# Patient Record
Sex: Female | Born: 1985 | Race: Black or African American | Hispanic: No | Smoking: Never smoker
Health system: Southern US, Community
[De-identification: ages and names within clinical notes are randomized; demographics above are authoritative.]

## PROBLEM LIST (undated history)

## (undated) ENCOUNTER — Inpatient Hospital Stay (HOSPITAL_COMMUNITY): Payer: Self-pay

## (undated) DIAGNOSIS — N809 Endometriosis, unspecified: Secondary | ICD-10-CM

## (undated) DIAGNOSIS — I493 Ventricular premature depolarization: Secondary | ICD-10-CM

## (undated) DIAGNOSIS — I499 Cardiac arrhythmia, unspecified: Secondary | ICD-10-CM

## (undated) DIAGNOSIS — N6011 Diffuse cystic mastopathy of right breast: Secondary | ICD-10-CM

## (undated) HISTORY — DX: Diffuse cystic mastopathy of right breast: N60.11

## (undated) HISTORY — PX: WISDOM TOOTH EXTRACTION: SHX21

## (undated) HISTORY — DX: Ventricular premature depolarization: I49.3

---

## 2007-04-27 ENCOUNTER — Ambulatory Visit: Payer: Self-pay | Admitting: Family

## 2007-04-30 ENCOUNTER — Encounter: Admission: RE | Admit: 2007-04-30 | Discharge: 2007-04-30 | Payer: Self-pay | Admitting: Obstetrics & Gynecology

## 2007-05-04 ENCOUNTER — Ambulatory Visit: Payer: Self-pay | Admitting: Family

## 2007-06-01 ENCOUNTER — Ambulatory Visit: Payer: Self-pay | Admitting: Family

## 2007-10-26 ENCOUNTER — Ambulatory Visit: Payer: Self-pay | Admitting: Physician Assistant

## 2008-03-03 ENCOUNTER — Ambulatory Visit: Payer: Self-pay | Admitting: Obstetrics and Gynecology

## 2008-03-05 ENCOUNTER — Encounter: Payer: Self-pay | Admitting: Obstetrics and Gynecology

## 2008-03-05 LAB — CONVERTED CEMR LAB: hCG, Beta Chain, Quant, S: 12722.4 milliintl units/mL

## 2008-04-04 ENCOUNTER — Ambulatory Visit: Payer: Self-pay | Admitting: Obstetrics and Gynecology

## 2009-03-04 ENCOUNTER — Ambulatory Visit: Payer: Self-pay | Admitting: Obstetrics & Gynecology

## 2009-03-05 ENCOUNTER — Encounter: Payer: Self-pay | Admitting: Obstetrics & Gynecology

## 2009-03-05 LAB — CONVERTED CEMR LAB

## 2009-03-28 IMAGING — US US PELVIS COMPLETE MODIFY
1 series · 14 of 25 positions shown · non-contrast
Comparison: none

CLINICAL DATA: Left-sided pelvic pain.
 TRANSABDOMINAL AND TRANSVAGINAL PELVIC ULTRASOUND:
TECHNIQUE: Both transabdominal and transvaginal ultrasound examinations of the pelvis were performed, including evaluation of the uterus, ovaries, adnexal regions, and pelvic cul-de-sac.

[Series 1: unknown · 0.21mm/px · 14 of 51 slices shown]
[im 1/51]
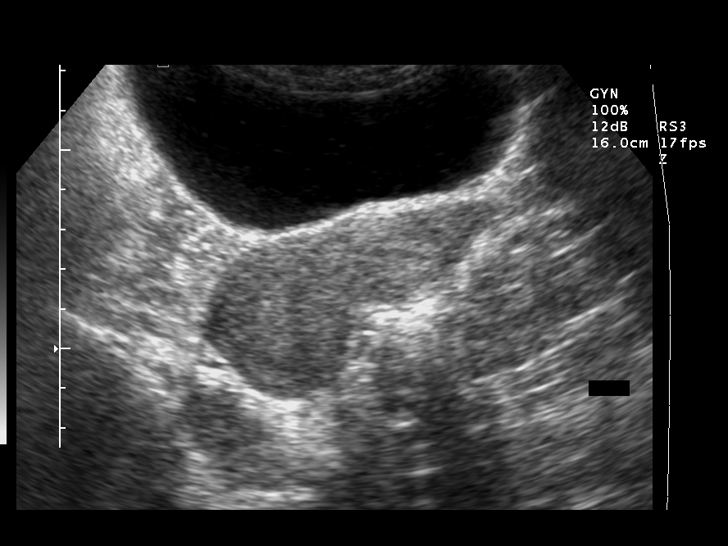
[im 5/51]
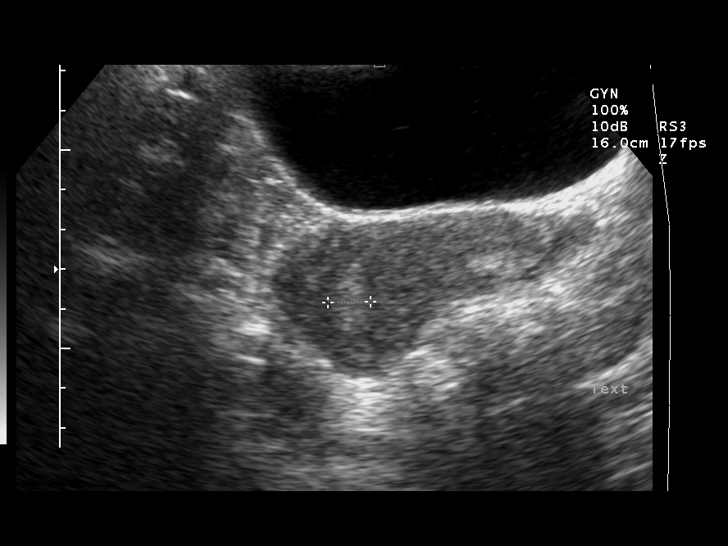
[im 9/51]
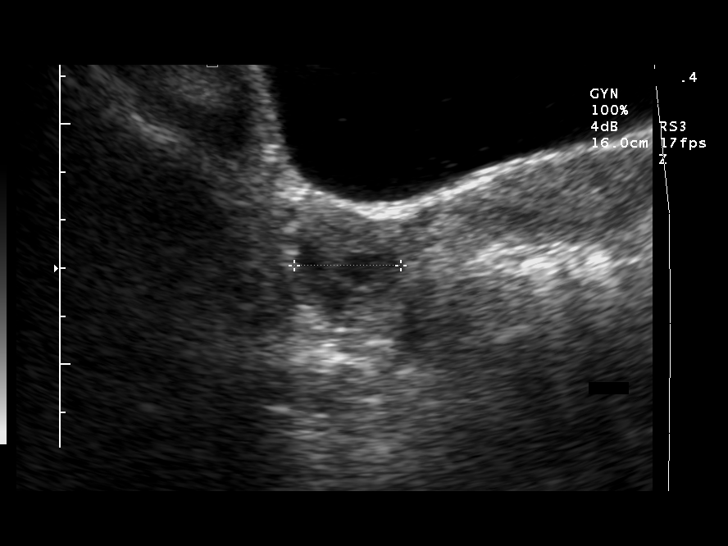
[im 13/51]
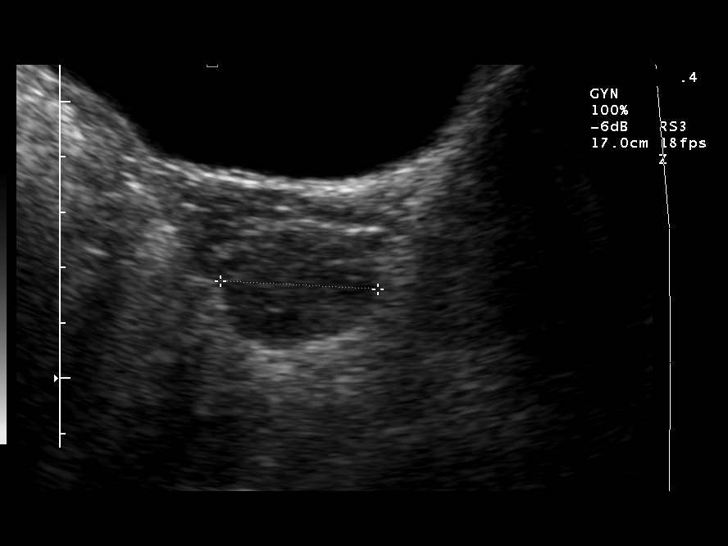
[im 17/51]
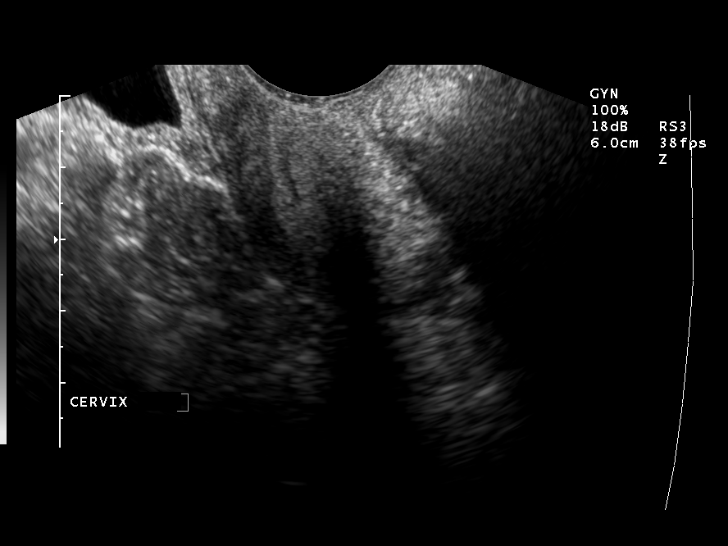
[im 19/51]
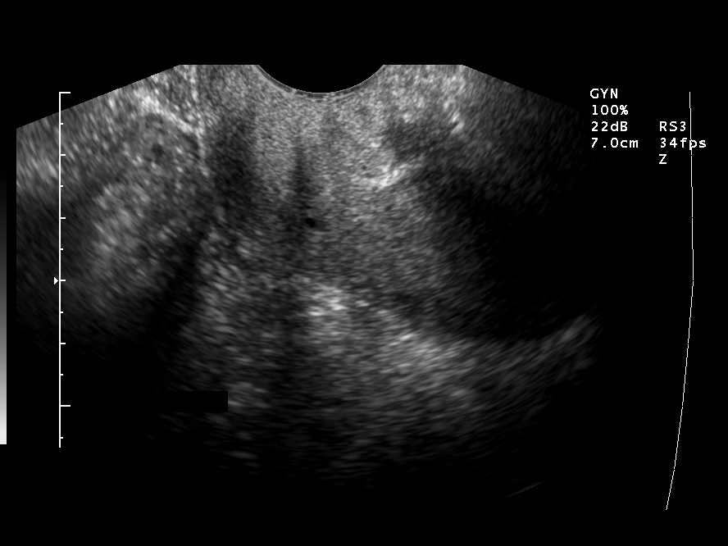
[im 23/51]
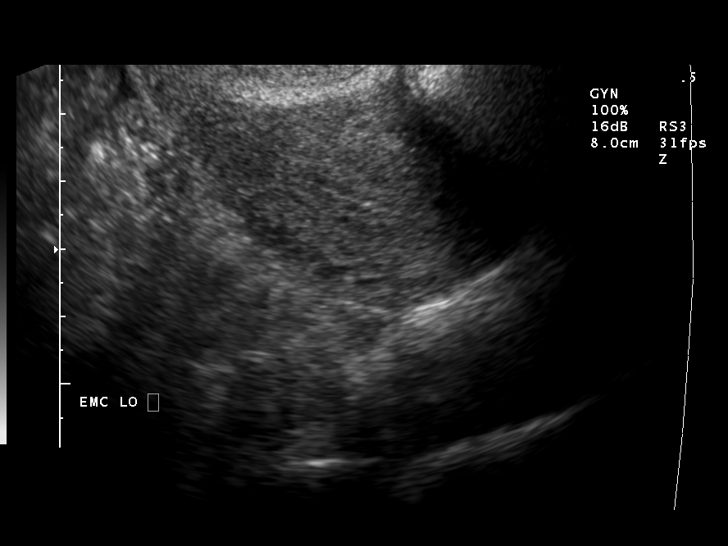
[im 28/51]
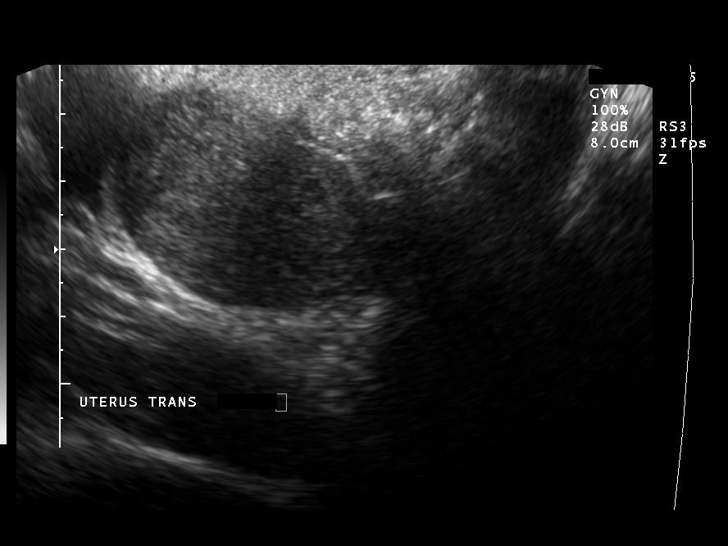
[im 32/51]
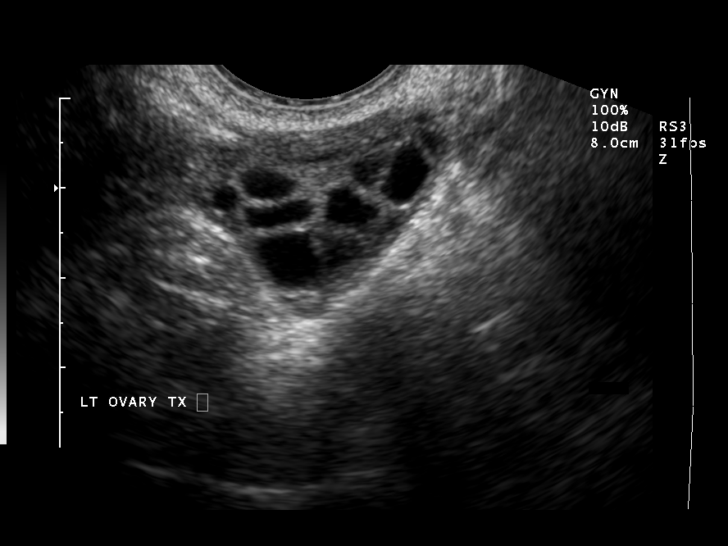
[im 34/51]
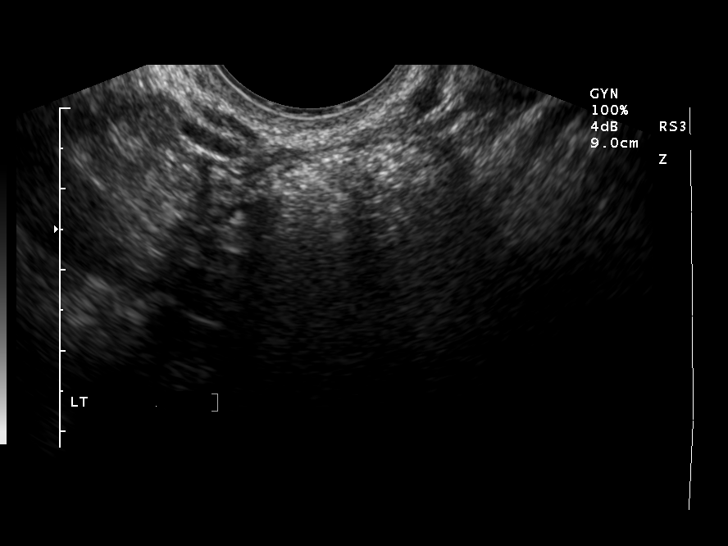
[im 38/51]
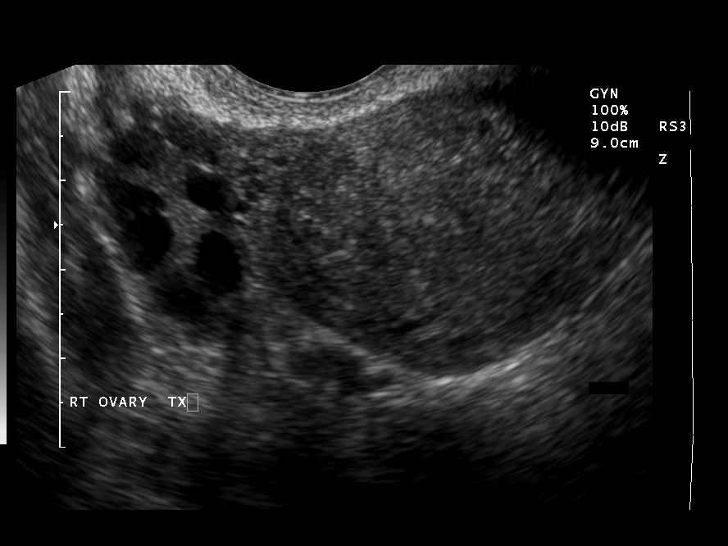
[im 42/51]
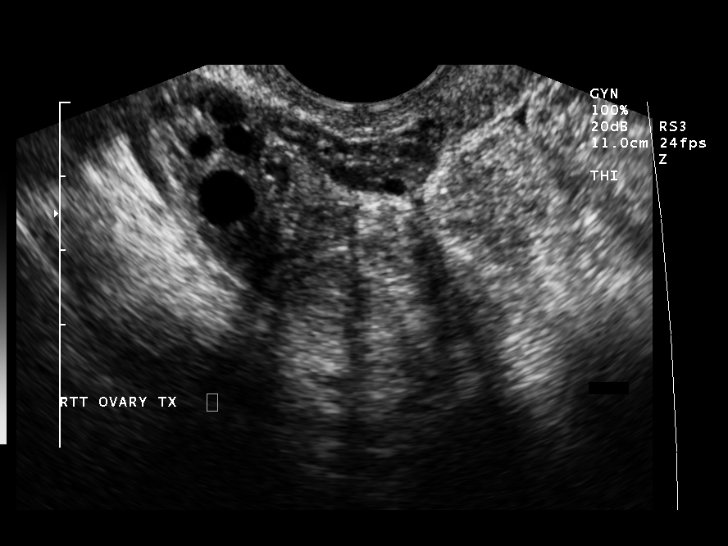
[im 46/51]
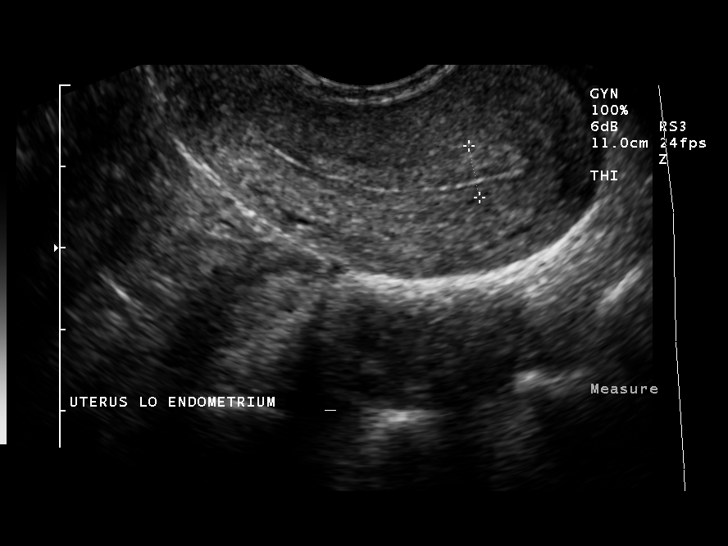
[im 51/51]
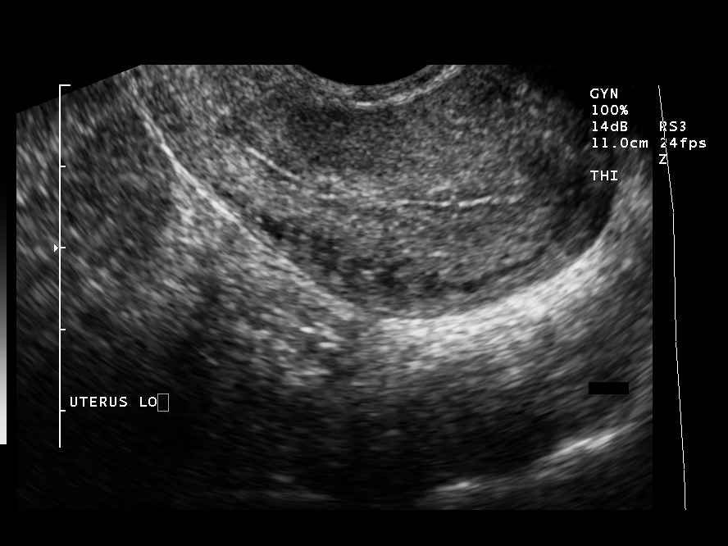

[14 of 25 positions shown; findings below may reference images not displayed]

FINDINGS: The uterus and ovaries are unremarkable.  Trace free pelvic fluid is incidentally noted.
IMPRESSION: No acute findings.  Normal exam.

## 2009-06-04 ENCOUNTER — Ambulatory Visit: Payer: Self-pay | Admitting: Family Medicine

## 2010-01-25 ENCOUNTER — Ambulatory Visit: Payer: Self-pay | Admitting: Advanced Practice Midwife

## 2010-01-25 ENCOUNTER — Encounter: Payer: Self-pay | Admitting: Obstetrics and Gynecology

## 2010-01-25 LAB — CONVERTED CEMR LAB
HIV: NONREACTIVE
Hepatitis B Surface Ag: NEGATIVE
Lymphocytes Relative: 27 % (ref 12–46)
Lymphs Abs: 2 10*3/uL (ref 0.7–4.0)
Neutrophils Relative %: 65 % (ref 43–77)
Platelets: 303 10*3/uL (ref 150–400)
Rubella: 143.2 intl units/mL — ABNORMAL HIGH
WBC: 7.6 10*3/uL (ref 4.0–10.5)

## 2010-01-26 ENCOUNTER — Encounter: Payer: Self-pay | Admitting: Obstetrics and Gynecology

## 2010-01-26 LAB — CONVERTED CEMR LAB: GC Probe Amp, Genital: NEGATIVE

## 2010-02-22 ENCOUNTER — Ambulatory Visit
Admission: RE | Admit: 2010-02-22 | Discharge: 2010-02-22 | Payer: Self-pay | Source: Home / Self Care | Attending: Obstetrics & Gynecology | Admitting: Obstetrics & Gynecology

## 2010-03-03 ENCOUNTER — Ambulatory Visit (HOSPITAL_COMMUNITY)
Admission: RE | Admit: 2010-03-03 | Discharge: 2010-03-03 | Payer: Self-pay | Source: Home / Self Care | Attending: Obstetrics & Gynecology | Admitting: Obstetrics & Gynecology

## 2010-03-09 NOTE — Assessment & Plan Note (Signed)
Summary: COUG/CONGESTION   Vital Signs:  Patient Profile:   25 Years Old Female CC:      congestion and dry cough X 4 days Height:     60.5 inches Weight:      113 pounds O2 Sat:      100 % O2 treatment:    Room Air Temp:     98.4 degrees F oral Pulse rate:   69 / minute Pulse rhythm:   regular Resp:     12 per minute BP sitting:   120 / 63  (right arm) Cuff size:   regular  Pt. in pain?   no  Vitals Entered By: Lajean Saver RN (June 04, 2009 10:33 AM)                   Updated Prior Medication List: TYLENOL COLD HEAD CONGESTION 5-10-200-325 MG TABS (PHENYLEPHRINE-DM-GG-APAP) as needed  Current Allergies: ! PCN ! * LATEXHistory of Present Illness Chief Complaint: congestion and dry cough X 4 days History of Present Illness: ONSET MONDAY WITH A SORE THROAT THAT HAS RESOLVED. NO FEVER, SOME DRY COUGH THAT IS WORSE AT NIGHT. NO EAR PAIN. TAKING TYLENOL COLD.   REVIEW OF SYSTEMS Constitutional Symptoms      Denies fever, chills, night sweats, weight loss, weight gain, and fatigue.  Eyes       Denies change in vision, eye pain, eye discharge, glasses, contact lenses, and eye surgery. Ear/Nose/Throat/Mouth       Complains of frequent runny nose and sinus problems.      Denies hearing loss/aids, change in hearing, ear pain, ear discharge, dizziness, frequent nose bleeds, sore throat, hoarseness, and tooth pain or bleeding.  Respiratory       Complains of dry cough.      Denies productive cough, wheezing, shortness of breath, asthma, bronchitis, and emphysema/COPD.  Cardiovascular       Denies murmurs, chest pain, and tires easily with exhertion.    Gastrointestinal       Complains of nausea/vomiting.      Denies stomach pain, diarrhea, constipation, blood in bowel movements, and indigestion. Genitourniary       Denies painful urination, kidney stones, and loss of urinary control. Neurological       Denies paralysis, seizures, and fainting/blackouts. Musculoskeletal       Denies muscle pain, joint pain, joint stiffness, decreased range of motion, redness, swelling, muscle weakness, and gout.  Skin       Denies bruising, unusual mles/lumps or sores, and hair/skin or nail changes.  Psych       Denies mood changes, temper/anger issues, anxiety/stress, speech problems, depression, and sleep problems.  Past History:  Past Medical History: Unremarkable  Past Surgical History: wisdom teeth removal  Family History: Family History Diabetes 1st degree relative- father Family History of Asthma- mother  Social History: Alcohol use-no Drug use-no Drug Use:  no Physical Exam General appearance: well developed, well nourished, no acute distress Head: normocephalic, atraumatic Eyes: conjunctivae and lids normal Ears: normal, no lesions or deformities Nasal: mucosa pink, nonedematous, no septal deviation, turbinates normal Oral/Pharynx: MILD ERYTHEMA Neck: neck supple,  trachea midline, no masses Chest/Lungs: no rales, wheezes, or rhonchi bilateral, breath sounds equal without effort Heart: regular rate and  rhythm, no murmur Extremities: normal extremities Assessment New Problems: UPPER RESPIRATORY INFECTION, ACUTE (ICD-465.9) FAMILY HISTORY OF ASTHMA (ICD-V17.5) FAMILY HISTORY DIABETES 1ST DEGREE RELATIVE (ICD-V18.0)   Plan New Medications/Changes: BROMFED DM 30-2-10 MG/5ML SYRP (PSEUDOEPH-BROMPHEN-DM) 1-2 TSP by mouth  Q 6 HRS as needed COUGH AND CONGESTION  #4 OZ x 0, 06/04/2009, Kimberly Lykins DO ZITHROMAX Z-PAK 250 MG TABS (AZITHROMYCIN) TAKE AS DIRECTED  #1 PK x 0, 06/04/2009, Marvis Moeller DO  New Orders: New Patient Level III [99203]   Prescriptions: BROMFED DM 30-2-10 MG/5ML SYRP (PSEUDOEPH-BROMPHEN-DM) 1-2 TSP by mouth Q 6 HRS as needed COUGH AND CONGESTION  #4 OZ x 0   Entered and Authorized by:   Marvis Moeller DO   Signed by:   Marvis Moeller DO on 06/04/2009   Method used:   Print then Give to Patient   RxID:    [id number] ZITHROMAX Z-PAK 250 MG TABS (AZITHROMYCIN) TAKE AS DIRECTED  #1 PK x 0   Entered and Authorized by:   Marvis Moeller DO   Signed by:   Marvis Moeller DO on 06/04/2009   Method used:   Print then Give to Patient   RxID:   [id number]   Patient Instructions: 1)  TYLENOL OR MOTRIN AS NEEDED. AVOID CAFFEINE AND MILK PRODUCTS. FOLLOW UP WITH YOUR PCP OR RETURN IF SYMPTOMS PERSIST OR WORSEN.

## 2010-03-24 ENCOUNTER — Other Ambulatory Visit: Payer: Self-pay | Admitting: Obstetrics & Gynecology

## 2010-03-24 ENCOUNTER — Other Ambulatory Visit: Payer: Self-pay | Admitting: Obstetrics and Gynecology

## 2010-03-24 ENCOUNTER — Encounter: Payer: BC Managed Care – PPO | Admitting: Obstetrics & Gynecology

## 2010-03-24 DIAGNOSIS — Z1272 Encounter for screening for malignant neoplasm of vagina: Secondary | ICD-10-CM

## 2010-03-24 DIAGNOSIS — Z3400 Encounter for supervision of normal first pregnancy, unspecified trimester: Secondary | ICD-10-CM

## 2010-03-24 DIAGNOSIS — Z3689 Encounter for other specified antenatal screening: Secondary | ICD-10-CM

## 2010-03-24 DIAGNOSIS — Z113 Encounter for screening for infections with a predominantly sexual mode of transmission: Secondary | ICD-10-CM

## 2010-04-07 ENCOUNTER — Ambulatory Visit (HOSPITAL_COMMUNITY)
Admission: RE | Admit: 2010-04-07 | Discharge: 2010-04-07 | Disposition: A | Discharge status: Home / Self Care | Payer: BC Managed Care – PPO | Source: Ambulatory Visit | Attending: Obstetrics & Gynecology | Admitting: Obstetrics & Gynecology

## 2010-04-07 DIAGNOSIS — Z3689 Encounter for other specified antenatal screening: Secondary | ICD-10-CM

## 2010-04-07 DIAGNOSIS — O358XX0 Maternal care for other (suspected) fetal abnormality and damage, not applicable or unspecified: Secondary | ICD-10-CM | POA: Insufficient documentation

## 2010-04-07 DIAGNOSIS — Z363 Encounter for antenatal screening for malformations: Secondary | ICD-10-CM | POA: Insufficient documentation

## 2010-04-07 DIAGNOSIS — Z1389 Encounter for screening for other disorder: Secondary | ICD-10-CM | POA: Insufficient documentation

## 2010-04-21 ENCOUNTER — Encounter: Payer: BC Managed Care – PPO | Admitting: Obstetrics & Gynecology

## 2010-04-21 DIAGNOSIS — Z3400 Encounter for supervision of normal first pregnancy, unspecified trimester: Secondary | ICD-10-CM

## 2010-05-21 DIAGNOSIS — Z3400 Encounter for supervision of normal first pregnancy, unspecified trimester: Secondary | ICD-10-CM

## 2010-06-18 ENCOUNTER — Encounter (INDEPENDENT_AMBULATORY_CARE_PROVIDER_SITE_OTHER): Payer: BC Managed Care – PPO

## 2010-06-18 DIAGNOSIS — Z3480 Encounter for supervision of other normal pregnancy, unspecified trimester: Secondary | ICD-10-CM

## 2010-06-22 NOTE — Assessment & Plan Note (Signed)
NAME:  Haley [name], Haley [name]              ACCOUNT NO.:  [id number]   MEDICAL RECORD NO.:  [id number]          PATIENT TYPE:  POB   LOCATION:  [facility] [place]         FACILITY:  [facility]   PHYSICIAN:  Caren Griffins, CNM       DATE OF BIRTH:  04/19/1985   DATE OF SERVICE:                                  CLINIC NOTE   REASON FOR VISIT:  Wants to discuss options for pregnancy.   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  LMP  was January 19, 2008, regular cycles, sure of date.  Occasionally uses  condoms and believes the condom broke.  She has been seen here before  for pelvic pain and menorrhagia.  She had a negative pelvic ultrasound  in March 2009.  She is aware of plan B, is aware of other options for  family planning.  They had some questions about options for termination  including medical versus surgical and how to know if she has an ectopic  pregnancy.  The couple are not interested in the adoption option and  concur with surety that they do not want to continue the pregnancy.  She  has had no spotting or pain, and denies vaginitis symptoms.   PHYSICAL EXAMINATION:  VITAL SIGNS:  Pulse 79, BP 116/75, weight 119,  height 5 feet 1 inch.  Physical exam is deferred.   Of note, she has had negative GC and chlamydia in November 2009 and  normal CBC, TSH, and Pap in March 2009.  Today, GPT is positive.   ASSESSMENT:  Five-week pregnancy, couple desirous of termination.   PLAN:  Discussed options at length as well as future fertility and  future need for contraception.  It is decided that she will go to  [facility] [place], and pursue the option for  termination.           ______________________________  Caren Griffins, CNM     DP/MEDQ  D:  03/03/2008  T:  03/04/2008  Job:  [id number]

## 2010-06-22 NOTE — Assessment & Plan Note (Signed)
NAMEJOVANI, Haley [name]              ACCOUNT NO.:  [id number]   MEDICAL RECORD NO.:  [id number]          PATIENT TYPE:  POB   LOCATION:  [facility] [place]         FACILITY:  [facility]   PHYSICIAN:  Caren Griffins, CNM       DATE OF BIRTH:  Aug 13, 1985   DATE OF SERVICE:  04/04/2008                                  CLINIC NOTE   REASON FOR VISIT:  Follow-up post elective abortion of 4 weeks ago.   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  She states that they  did do an ultrasound there showing a 5-week twin gestation and that she  had an uncomplicated course.  She took her antibiotics for 3 days.  She  has not had a menstrual period yet.  They counseled her and prescribed  for her the NuvaRing which she is going to begin on Sunday.  She has no  regrets about the procedure and is doing well.   OBJECTIVE:  Exam deferred other than vaginal probe ultrasound by Vernona Rieger  showing an empty uterus with endometrial lining build-up.  The patient  is reassured that she will probably have menses soon and we reviewed use  of the NuvaRing.  She will return here in 6 months to see how she is  doing with that, or sooner if she has any difficulties.           ______________________________  Caren Griffins, CNM     DP/MEDQ  D:  04/04/2008  T:  04/04/2008  Job:  [id number]

## 2010-06-22 NOTE — Assessment & Plan Note (Signed)
Haley [name], Haley [name]              ACCOUNT NO.:  [id number]   MEDICAL RECORD NO.:  [id number]          PATIENT TYPE:  POB   LOCATION:  [facility] [place]         FACILITY:  [facility]   PHYSICIAN:  Maylon Cos, CNM    DATE OF BIRTH:  1985/06/20   DATE OF SERVICE:  10/26/2007                                  CLINIC NOTE   The patient is being seen today, October 26, 2007, as a problem visit.  The patient presents today, a 25 year old [place] American female, with  complaint of a white vaginal discharge since Wednesday.  She describes  the discharge as white in color, causing itching and irritation.  It  started with an odor yesterday.  She states that the first day of her  last menstrual period was on October 10, 2007, and that this period was  early, and that it did not last many days as it normally does.  She does  state that she is with the same sexual partner, however, that she has  been for the last several months, and they use condoms for birth  control.  However, they use them inconsistently, and due to a LATEX  allergy, they use latex-free condoms.  Her exam today is going to be  problem focused.   On assessment, her vital signs are stable.  Her blood pressure is  122/82, and her weight is 119.  She is a pleasant African American  female who appears to be younger than her stated age of 36.  She is in  no apparent distress and is alert and oriented x3.  She is a Tanner V.  Her external genitalia are without lesions and without discharge.  Her  mucous membranes are pink and slightly erythematous.  There is a large  amount of adherent, white, clumpy discharge in the vault consistent with  yeast.  Cervix is easily visualized, is smooth, pink, and nonfriable.  Her abdomen is nontender with bimanual exam, and there is no cervical  motion tenderness.   Her wet prep is positive for hyphae only.   ASSESSMENT AND PLAN:  She has vaginitis consistent with candidal  infection, and we  are screening today for gonorrhea and chlamydia  secondary to abnormality in this period and secondary to her request.   PLAN:  We are giving her a prescription for Diflucan 150 mg one now,  repeat in 3 days.  Recommend baking soda soaks for comfort p.r.n.  She  is also due for an annual wellness exam and Pap smear, and she is  encouraged to follow up as soon as possible for this.            ______________________________  Maylon Cos, CNM     SS/MEDQ  D:  10/26/2007  T:  10/27/2007  Job:  [id number]

## 2010-06-22 NOTE — Assessment & Plan Note (Signed)
Haley [name], Haley [name]              ACCOUNT NO.:  [id number]   MEDICAL RECORD NO.:  [id number]          PATIENT TYPE:  POB   LOCATION:  [facility] [place]         FACILITY:  [facility]   PHYSICIAN:  Elsie Lincoln, MD      DATE OF BIRTH:  October 14, 1985   DATE OF SERVICE:  03/04/2009                                  CLINIC NOTE   HISTORY OF PRESENT ILLNESS:  The patient is 25 year old female who  presented for yearly exam.  The patient was complaining of frequency and  foul-smelling urine.  Urinalysis shows nitrites today so we will send a  culture and treat empirically with Cipro.  The patient uses condoms for  birth control.  She has a latex allergy, so non latex condoms.  She says  she is not able to take pills or NuvaRing.  We talked about the Mirena  and she will consider.  The patient had a recent termination for twin  pregnancy.  She does not want pregnancy for 2 more years, I think a  Mirena IUD would be good.  The patient is graduating from school in May  and wants to go to [employer] in the fall.  She plans on getting  married in about a year and then plans a child after that.  The patient  is now 89 years old and needs Pap smear.  The patient agrees to HIV  testing and needs GC and Chlamydia testing today.   MEDICAL PROBLEMS:  Denies all medical problems.   PAST SURGICAL HISTORY:  TOP.   PAST GYN HISTORY:  Ovarian cyst, fibroid tumors, and sexually  transmitted diseases.   FAMILY HISTORY:  Negative for blood clots.  No familial cancers.   MEDICATIONS:  Multivitamin.   ALLERGIES:  Penicillin and latex.   PHYSICAL EXAMINATION:  VITAL SIGNS:  Pulse 79, blood pressure 139/91,  and weight 113.  Height 61 inches.  GENERAL:  Well nourished and well developed, in no apparent distress.  HEENT:  Normocephalic and atraumatic.  Thyroid no masses.  LUNGS:  Clear to auscultation bilaterally.  HEART:  Regular rate and rhythm.  BREASTS:  No masses and nontender.  No  lymphadenopathy.  ABDOMEN:  Soft and nontender.  No organomegaly.  No rebound.  No  guarding.  GENITALIA:  Tanner V.  Vagina pink.  Normal rugae.  Cervix  closed and nontender.  Uterus nontender.  Adnexa no masses and  nontender.  No rectocele.  No cystocele.  No hemorrhoids.  EXTREMITIES:  No edema.   ASSESSMENT AND PLAN:  A 25 year old female who presents for yearly exam.  1. Pap smear.  2. GC and Chlamydia.  3. Human immunodeficiency virus.  4. Information given on Mirena.           ______________________________  Elsie Lincoln, MD     KL/MEDQ  D:  03/04/2009  T:  03/05/2009  Job:  [id number]

## 2010-06-22 NOTE — Assessment & Plan Note (Signed)
Haley [name], Haley [name]              ACCOUNT NO.:  [id number]   MEDICAL RECORD NO.:  [id number]          PATIENT TYPE:  POB   LOCATION:  [facility] [place]         FACILITY:  [facility]   PHYSICIAN:  Sid Falcon, CNM  DATE OF BIRTH:  May 13, 1985   DATE OF SERVICE:                                  CLINIC NOTE   The patient is here for followup discussion regarding labs and  ultrasound for pelvic pain.   The patient repeated at this visit pelvic pain has decreased.  She  requests information on NuvaRing for birth control.   LABORATORY:  Chlamydia and gonorrhea were negative x2.  CBC was within  normal limits.  TSH was 0.989 within normal limits.  Serum HCG was less  than 2 and pelvic ultrasound reported no acute findings.   The patient will follow up in 4 weeks after menses to discuss the pain  with that one or sooner if needed.      Sid Falcon, CNM     WM/MEDQ  D:  05/04/2007  T:  05/04/2007  Job:  [id number]

## 2010-06-22 NOTE — Assessment & Plan Note (Signed)
Haley [name], Haley [name]              ACCOUNT NO.:  [id number]   MEDICAL RECORD NO.:  [id number]          PATIENT TYPE:  POB   LOCATION:  [facility] [place]         FACILITY:  [facility]   PHYSICIAN:  Sid Falcon, CNM  DATE OF BIRTH:  01-03-1986   DATE OF SERVICE:                                  CLINIC NOTE   CHIEF COMPLAINT:  The patient reports to the clinic with complaints of  pelvic pain x approximately 2 weeks worse with menses.   ALLERGIES:  PENICILLIN AND LATEX.   LMP April 23, 2007 which was normal.  The previous cycle was on March 26, 2007.  Last PAP smear was in July, 2008, normal.   CURRENT MEDICATIONS:  None.   MENSTRUAL HISTORY:  The patient has regular cycle every month with 24  days in between.  It lasts approximately 7 days, is reported as a heavy  cycle with each one.  The pain has become severe, beginning in July of  2008.  There is no bleeding in between periods.   CONTRACEPTIVE HISTORY:  The patient is currently using condoms, states  regular, does not use a latex condoms, uses the polyurethane.  She has  one partner and no concerns for infidelity.   OBSTETRICAL HISTORY:  Never pregnant.   GYNECOLOGICAL HISTORY:  No history of ovarian cysts or endometriosis.   PAST SURGICAL HISTORY:  Denies except for wisdom teeth removed in  December 2007.   FAMILY HISTORY:  Father with diabetes and grandmother with hypertension.  No other significant health problems in the family.   SOCIAL HISTORY:  The patient is in a relationship, lives with parents.  Denies alcohol use and smoking or illicit drug use. She does drink  caffeinated beverages, approximately 1-2 a day.   REVIEW OF SYSTEMS:  The patient is reporting the pelvic pain, thigh pain  that radiates to the sides x 4 days.  She did have a fever 3 days ago  with a systemic viral illness.  Fatigue for the past 3 weeks.  She has  had a 14 pound weight loss in 2 months.  Headache 2 weeks ago before the  cycle.  Denies current headache and denies pain with bowel movement.   PHYSICAL EXAMINATION:  Upon exam today, the vagina has no abnormal  discharge, no odor, no lesions.  CERVIX:  Negative for abnormal discharge, no lesions.  Negative cervical  motion tenderness.  UTERUS:  Was mobile, approximately the size of an orange.  No dominant  masses palpated.  ADNEXA:  The right adnexa was normal, nontender, no abnormal masses.  The left adnexa enlarged ovary approximately 3 x3 cm size, tender with  palpation.  No dominant masses palpated within the adnexa.   ASSESSMENT:  1. Pelvic pain.  2. Metrorrhagia.   PLAN:  We are going to a STAT serum HCG.  If it is greater than 5, the  patient will be called to go to maternity admissions unit at [facility] for a STAT pelvic ultrasound.  If it is less than 5, the  patient has a pelvic ultrasound scheduled for Monday, April 30, 2007.   LABORATORY DATA:  Chlamydia, gonorrhea, thyroid stimulating hormones and  a CBS.  She will followup in 2 weeks or sooner if needed.      Sid Falcon, CNM     WM/MEDQ  D:  04/27/2007  T:  04/27/2007  Job:  [id number]

## 2010-07-02 ENCOUNTER — Encounter (INDEPENDENT_AMBULATORY_CARE_PROVIDER_SITE_OTHER): Payer: BC Managed Care – PPO

## 2010-07-02 DIAGNOSIS — Z3480 Encounter for supervision of other normal pregnancy, unspecified trimester: Secondary | ICD-10-CM

## 2010-07-12 ENCOUNTER — Inpatient Hospital Stay (HOSPITAL_COMMUNITY): Payer: BC Managed Care – PPO

## 2010-07-12 ENCOUNTER — Inpatient Hospital Stay (HOSPITAL_COMMUNITY)
Admission: AD | Admit: 2010-07-12 | Discharge: 2010-07-14 | DRG: 379 | Disposition: A | Discharge status: Home / Self Care | Payer: BC Managed Care – PPO | Source: Ambulatory Visit | Attending: Obstetrics and Gynecology | Admitting: Obstetrics and Gynecology

## 2010-07-12 DIAGNOSIS — O4700 False labor before 37 completed weeks of gestation, unspecified trimester: Principal | ICD-10-CM | POA: Diagnosis present

## 2010-07-12 LAB — WET PREP, GENITAL
Clue Cells Wet Prep HPF POC: NONE SEEN
Yeast Wet Prep HPF POC: NONE SEEN

## 2010-07-12 LAB — URINALYSIS, ROUTINE W REFLEX MICROSCOPIC
Glucose, UA: NEGATIVE mg/dL
Protein, ur: NEGATIVE mg/dL
pH: 7 (ref 5.0–8.0)

## 2010-07-12 LAB — CBC
HCT: 35.2 % — ABNORMAL LOW (ref 36.0–46.0)
MCH: 33.6 pg (ref 26.0–34.0)
MCHC: 34.7 g/dL (ref 30.0–36.0)
RDW: 12.7 % (ref 11.5–15.5)

## 2010-07-13 LAB — STREP B DNA PROBE

## 2010-07-16 ENCOUNTER — Encounter (INDEPENDENT_AMBULATORY_CARE_PROVIDER_SITE_OTHER): Payer: Medicaid Other

## 2010-07-16 DIAGNOSIS — Z3480 Encounter for supervision of other normal pregnancy, unspecified trimester: Secondary | ICD-10-CM

## 2010-07-16 DIAGNOSIS — O4700 False labor before 37 completed weeks of gestation, unspecified trimester: Secondary | ICD-10-CM

## 2010-07-30 ENCOUNTER — Encounter (INDEPENDENT_AMBULATORY_CARE_PROVIDER_SITE_OTHER): Payer: Medicaid Other

## 2010-07-30 DIAGNOSIS — O479 False labor, unspecified: Secondary | ICD-10-CM

## 2010-07-30 DIAGNOSIS — Z3400 Encounter for supervision of normal first pregnancy, unspecified trimester: Secondary | ICD-10-CM

## 2010-08-13 ENCOUNTER — Encounter (INDEPENDENT_AMBULATORY_CARE_PROVIDER_SITE_OTHER): Payer: Medicaid Other

## 2010-08-13 DIAGNOSIS — Z3480 Encounter for supervision of other normal pregnancy, unspecified trimester: Secondary | ICD-10-CM

## 2010-08-15 ENCOUNTER — Inpatient Hospital Stay (HOSPITAL_COMMUNITY): Payer: Medicaid Other | Admitting: Family Medicine

## 2010-08-15 ENCOUNTER — Encounter (HOSPITAL_COMMUNITY): Payer: Self-pay | Admitting: *Deleted

## 2010-08-15 ENCOUNTER — Inpatient Hospital Stay (HOSPITAL_COMMUNITY)
Admission: AD | Admit: 2010-08-15 | Discharge: 2010-08-15 | Disposition: A | Discharge status: Home / Self Care | Payer: Medicaid Other | Source: Ambulatory Visit | Attending: Family Medicine | Admitting: Family Medicine

## 2010-08-15 DIAGNOSIS — O99891 Other specified diseases and conditions complicating pregnancy: Secondary | ICD-10-CM | POA: Insufficient documentation

## 2010-08-15 DIAGNOSIS — O4700 False labor before 37 completed weeks of gestation, unspecified trimester: Secondary | ICD-10-CM

## 2010-08-15 DIAGNOSIS — O9989 Other specified diseases and conditions complicating pregnancy, childbirth and the puerperium: Secondary | ICD-10-CM

## 2010-08-15 LAB — AMNISURE RUPTURE OF MEMBRANE (ROM) NOT AT ARMC: Amnisure ROM: NEGATIVE

## 2010-08-15 NOTE — Initial Assessments (Signed)
Pt presents to mau for c/o ROM.  States she felt leaking after standing up at 8pm. Denies any bleeding.  +fm per pt.

## 2010-08-15 NOTE — Progress Notes (Signed)
T. Shawnie Pons, MD in to see pt.  Strip reviewed and poc discussed with pt.  Sterile speculum exam done, fern collected.  VE done.

## 2010-08-15 NOTE — Progress Notes (Signed)
Amnisure collected at this time.

## 2010-08-15 NOTE — Progress Notes (Signed)
T. Shawnie Pons, MD notified of negative amnisure.

## 2010-08-15 NOTE — ED Provider Notes (Signed)
Haley [name] is a 25 y.o. female presenting for ? Leakage of fluid. Maternal Medical History:  Reason for admission: Reason for admission: rupture of membranes.  Reason for Admission:   nauseaContractions: Onset was 1-2 hours ago.   Frequency: regular.   Perceived severity is moderate.    Fetal activity: Perceived fetal activity is normal.    Prenatal complications: No bleeding or hypertension.     OB History    Grav Para Term Preterm Abortions TAB SAB Ect Mult Living   2 0 0 0 1 1 0 0 0 0      Past Medical History  Diagnosis Date  . No pertinent past medical history    Past Surgical History  Procedure Date  . Wisdom tooth extraction    Family History: family history includes Asthma in her mother and Diabetes in her father. Social History:  reports that she has never smoked. She does not have any smokeless tobacco history on file. She reports that she does not drink alcohol or use illicit drugs.  Review of Systems  Respiratory: Negative for cough and shortness of breath.   Cardiovascular: Negative for chest pain.  Gastrointestinal: Negative for nausea and abdominal pain.  Genitourinary: Negative for dysuria and frequency.  Neurological: Negative for headaches.    Dilation: 2 Effacement (%): 70 Station: +1 Exam by:: Tanya S. Shawnie Pons, MD Blood pressure 118/73, pulse 72, temperature 98.9 F (37.2 C), temperature source Oral, resp. rate 20, height 5\' 1"  (1.549 m), weight 57.698 kg (127 lb 3.2 oz). Maternal Exam:  Uterine Assessment: Contraction strength is mild.  Contraction frequency is regular.   Abdomen: Fetal presentation: vertex  Introitus: Normal vulva. Vagina is positive for vaginal discharge (white).  Ferning test: negative.  Amnisure negative     Fetal Exam Fetal Monitor Review: Baseline rate: 140.  Variability: moderate (6-25 bpm).   Pattern: accelerations present.    Fetal State Assessment: Category I - tracings are normal.     Physical Exam    Constitutional: She is oriented to person, place, and time. She appears well-developed and well-nourished.  HENT:  Head: Normocephalic and atraumatic.  Neck: Normal range of motion.  Cardiovascular: Normal rate.   Respiratory: Effort normal.  GI: Soft. There is no tenderness.       gravid  Genitourinary: Vaginal discharge (white) found.  Neurological: She is alert and oriented to person, place, and time.    Prenatal labs: ABO, Rh:   Antibody: NEG (12/19 2349) Rubella:   RPR: NON REACTIVE (06/04 0359)  HBsAg: NEGATIVE (12/19 2349)  HIV: NON REACTIVE (12/19 2349)  GBS: NEGATIVE (06/04 0209)   Assessment/Plan: Not in labor No evidence of ROM FHR reassuring.  Will d/c home with labor precautions.   PRATT,TANYA S 08/15/2010, 2:20 AM

## 2010-08-15 NOTE — Progress Notes (Signed)
Been leaking fld since 2000. Creamy, watery fld. Small amt

## 2010-08-18 ENCOUNTER — Encounter: Payer: Self-pay | Admitting: Family Medicine

## 2010-08-18 ENCOUNTER — Encounter: Payer: Self-pay | Admitting: *Deleted

## 2010-08-18 ENCOUNTER — Inpatient Hospital Stay (HOSPITAL_COMMUNITY)
Admission: AD | Admit: 2010-08-18 | Discharge: 2010-08-18 | Disposition: A | Discharge status: Home / Self Care | Payer: Medicaid Other | Source: Ambulatory Visit | Attending: Obstetrics & Gynecology | Admitting: Obstetrics & Gynecology

## 2010-08-18 DIAGNOSIS — O479 False labor, unspecified: Secondary | ICD-10-CM | POA: Insufficient documentation

## 2010-08-18 NOTE — Progress Notes (Signed)
Subjective:    Haley [name] is a 25 y.o. G2P0010 [redacted]w[redacted]d being seen today for her obstetrical visit.  Patient reports contractions since this afternoon. Fetal movement: normal.No bleeding, no ROM.  Objective:    There were no vitals taken for this visit.  Physical Exam  Exam  FHT:  150 BPM reactive NST  Uterine Size: size equals dates  Presentation: cephalic     Assessment:    Pregnancy:  G2P0010    Plan:   37 wga False labor   Follow up in as scheduled.

## 2010-08-20 ENCOUNTER — Encounter (INDEPENDENT_AMBULATORY_CARE_PROVIDER_SITE_OTHER): Payer: Medicaid Other

## 2010-08-20 DIAGNOSIS — Z3400 Encounter for supervision of normal first pregnancy, unspecified trimester: Secondary | ICD-10-CM

## 2010-08-24 ENCOUNTER — Inpatient Hospital Stay (HOSPITAL_COMMUNITY)
Admission: AD | Admit: 2010-08-24 | Discharge: 2010-08-24 | Disposition: A | Discharge status: Home / Self Care | Payer: BC Managed Care – PPO | Source: Ambulatory Visit | Attending: Obstetrics and Gynecology | Admitting: Obstetrics and Gynecology

## 2010-08-24 DIAGNOSIS — O479 False labor, unspecified: Secondary | ICD-10-CM | POA: Insufficient documentation

## 2010-08-24 NOTE — Progress Notes (Signed)
Dr. Gwenlyn Saran notified of no cervical change. Notified of reactive fetal strip. Orders received to dc home.

## 2010-08-24 NOTE — Progress Notes (Signed)
Contractions, denies bleeding or ROM 

## 2010-08-24 NOTE — Progress Notes (Signed)
Dr. Thurmond Butts notified of VE and ctx pattern.  Notified of reassuring fetal strip.  Orders received to monitor for 1 hour and recheck cervix.

## 2010-08-24 NOTE — Progress Notes (Signed)
Pt presents to mau for labor check.  Denies leaking or bleeding.  efm and toco applied.  Assessing.

## 2010-08-25 ENCOUNTER — Inpatient Hospital Stay (HOSPITAL_COMMUNITY): Payer: BC Managed Care – PPO | Admitting: Anesthesiology

## 2010-08-25 ENCOUNTER — Encounter (HOSPITAL_COMMUNITY): Payer: Self-pay | Admitting: Anesthesiology

## 2010-08-25 ENCOUNTER — Inpatient Hospital Stay (HOSPITAL_COMMUNITY)
Admission: AD | Admit: 2010-08-25 | Discharge: 2010-08-27 | DRG: 373 | Disposition: A | Discharge status: Home / Self Care | Payer: BC Managed Care – PPO | Source: Ambulatory Visit | Attending: Obstetrics and Gynecology | Admitting: Obstetrics and Gynecology

## 2010-08-25 ENCOUNTER — Encounter (HOSPITAL_COMMUNITY): Payer: Self-pay | Admitting: *Deleted

## 2010-08-25 DIAGNOSIS — O328XX0 Maternal care for other malpresentation of fetus, not applicable or unspecified: Secondary | ICD-10-CM

## 2010-08-25 DIAGNOSIS — IMO0001 Reserved for inherently not codable concepts without codable children: Secondary | ICD-10-CM

## 2010-08-25 LAB — RPR: RPR Ser Ql: NONREACTIVE

## 2010-08-25 LAB — CBC
Hemoglobin: 13.4 g/dL (ref 12.0–15.0)
MCHC: 34.6 g/dL (ref 30.0–36.0)
Platelets: 196 10*3/uL (ref 150–400)
RDW: 12.8 % (ref 11.5–15.5)

## 2010-08-25 MED ORDER — DIPHENHYDRAMINE HCL 25 MG PO CAPS: 25.0000 mg | ORAL_CAPSULE | Freq: Four times a day (QID) | ORAL | Status: DC | PRN

## 2010-08-25 MED ORDER — ONDANSETRON HCL 4 MG PO TABS: 4.0000 mg | ORAL_TABLET | ORAL | Status: DC | PRN

## 2010-08-25 MED ORDER — LANOLIN HYDROUS EX OINT: TOPICAL_OINTMENT | CUTANEOUS | Status: DC | PRN

## 2010-08-25 MED ORDER — LIDOCAINE HCL 1.5 % IJ SOLN
INTRAMUSCULAR | Status: DC | PRN
  Administered 2010-08-25: 4 mL
  Administered 2010-08-25: 3 mL

## 2010-08-25 MED ORDER — FENTANYL 2.5 MCG/ML BUPIVACAINE 1/10 % EPIDURAL INFUSION (WH - ANES)
14.0000 mL/h | INTRAMUSCULAR | Status: DC
  Administered 2010-08-25: 14 mL/h via EPIDURAL
  Filled 2010-08-25: qty 60

## 2010-08-25 MED ORDER — ONDANSETRON HCL 4 MG/2ML IJ SOLN: 4.0000 mg | Freq: Four times a day (QID) | INTRAMUSCULAR | Status: DC | PRN

## 2010-08-25 MED ORDER — IBUPROFEN 600 MG PO TABS
600.0000 mg | ORAL_TABLET | Freq: Four times a day (QID) | ORAL | Status: DC
  Administered 2010-08-25 – 2010-08-27 (×7): 600 mg via ORAL
  Filled 2010-08-25 (×7): qty 1

## 2010-08-25 MED ORDER — PHENYLEPHRINE 40 MCG/ML (10ML) SYRINGE FOR IV PUSH (FOR BLOOD PRESSURE SUPPORT)
80.0000 ug | PREFILLED_SYRINGE | INTRAVENOUS | Status: DC | PRN
  Filled 2010-08-25 (×2): qty 5

## 2010-08-25 MED ORDER — ONDANSETRON HCL 4 MG/2ML IJ SOLN: 4.0000 mg | INTRAMUSCULAR | Status: DC | PRN

## 2010-08-25 MED ORDER — ZOLPIDEM TARTRATE 5 MG PO TABS: 5.0000 mg | ORAL_TABLET | Freq: Every evening | ORAL | Status: DC | PRN

## 2010-08-25 MED ORDER — PHENYLEPHRINE 40 MCG/ML (10ML) SYRINGE FOR IV PUSH (FOR BLOOD PRESSURE SUPPORT)
80.0000 ug | PREFILLED_SYRINGE | INTRAVENOUS | Status: DC | PRN
  Filled 2010-08-25: qty 5

## 2010-08-25 MED ORDER — WITCH HAZEL-GLYCERIN EX PADS: MEDICATED_PAD | CUTANEOUS | Status: DC | PRN

## 2010-08-25 MED ORDER — LACTATED RINGERS IV SOLN
INTRAVENOUS | Status: DC
  Administered 2010-08-25: 09:00:00 via INTRAVENOUS
  Administered 2010-08-25: 1000 mL via INTRAVENOUS

## 2010-08-25 MED ORDER — OXYCODONE-ACETAMINOPHEN 5-325 MG PO TABS: 1.0000 | ORAL_TABLET | ORAL | Status: DC | PRN

## 2010-08-25 MED ORDER — ACETAMINOPHEN 325 MG PO TABS
650.0000 mg | ORAL_TABLET | ORAL | Status: DC | PRN
Start: 2010-08-25 — End: 2010-08-25

## 2010-08-25 MED ORDER — SENNOSIDES-DOCUSATE SODIUM 8.6-50 MG PO TABS
1.0000 | ORAL_TABLET | Freq: Every day | ORAL | Status: DC
  Administered 2010-08-25: 2 via ORAL
  Administered 2010-08-26: 1 via ORAL

## 2010-08-25 MED ORDER — NALBUPHINE SYRINGE 5 MG/0.5 ML
5.0000 mg | INJECTION | INTRAMUSCULAR | Status: DC | PRN
  Administered 2010-08-25: 5 mg via INTRAVENOUS
  Filled 2010-08-25 (×2): qty 0.5

## 2010-08-25 MED ORDER — CITRIC ACID-SODIUM CITRATE 334-500 MG/5ML PO SOLN: 30.0000 mL | ORAL | Status: DC | PRN

## 2010-08-25 MED ORDER — DIPHENHYDRAMINE HCL 50 MG/ML IJ SOLN: 12.5000 mg | INTRAMUSCULAR | Status: DC | PRN

## 2010-08-25 MED ORDER — FLEET ENEMA 7-19 GM/118ML RE ENEM: 1.0000 | ENEMA | RECTAL | Status: DC | PRN

## 2010-08-25 MED ORDER — BENZOCAINE-MENTHOL 20-0.5 % EX AERO: 1.0000 "application " | INHALATION_SPRAY | CUTANEOUS | Status: DC | PRN

## 2010-08-25 MED ORDER — EPHEDRINE 5 MG/ML INJ
10.0000 mg | INTRAVENOUS | Status: DC | PRN
  Filled 2010-08-25 (×2): qty 4

## 2010-08-25 MED ORDER — LIDOCAINE HCL (PF) 1 % IJ SOLN
30.0000 mL | INTRAMUSCULAR | Status: DC | PRN
  Filled 2010-08-25 (×2): qty 30

## 2010-08-25 MED ORDER — OXYCODONE-ACETAMINOPHEN 5-325 MG PO TABS: 2.0000 | ORAL_TABLET | ORAL | Status: DC | PRN

## 2010-08-25 MED ORDER — SIMETHICONE 80 MG PO CHEW: 80.0000 mg | CHEWABLE_TABLET | ORAL | Status: DC | PRN

## 2010-08-25 MED ORDER — PRENATAL PLUS 27-1 MG PO TABS
1.0000 | ORAL_TABLET | Freq: Every day | ORAL | Status: DC
  Administered 2010-08-25 – 2010-08-27 (×3): 1 via ORAL
  Filled 2010-08-25 (×3): qty 1

## 2010-08-25 MED ORDER — IBUPROFEN 600 MG PO TABS: 600.0000 mg | ORAL_TABLET | Freq: Four times a day (QID) | ORAL | Status: DC | PRN

## 2010-08-25 MED ORDER — LACTATED RINGERS IV SOLN
500.0000 mL | Freq: Once | INTRAVENOUS | Status: AC
  Administered 2010-08-25: 1000 mL via INTRAVENOUS

## 2010-08-25 MED ORDER — LACTATED RINGERS IV SOLN: 500.0000 mL | INTRAVENOUS | Status: DC | PRN

## 2010-08-25 MED ORDER — TETANUS-DIPHTH-ACELL PERTUSSIS 5-2.5-18.5 LF-MCG/0.5 IM SUSP
0.5000 mL | Freq: Once | INTRAMUSCULAR | Status: AC
  Administered 2010-08-26: 0.5 mL via INTRAMUSCULAR
  Filled 2010-08-25: qty 0.5

## 2010-08-25 MED ORDER — EPHEDRINE 5 MG/ML INJ
10.0000 mg | INTRAVENOUS | Status: DC | PRN
  Filled 2010-08-25: qty 4

## 2010-08-25 MED ORDER — OXYTOCIN 20 UNITS IN LACTATED RINGERS INFUSION - SIMPLE
125.0000 mL/h | Freq: Once | INTRAVENOUS | Status: DC
  Administered 2010-08-25: 999 mL/h via INTRAVENOUS
  Filled 2010-08-25: qty 1000

## 2010-08-25 NOTE — Anesthesia Postprocedure Evaluation (Signed)
  Anesthesia Post-op Note  Patient: Haley [name]  Procedure(s) Performed: * Lumbar Epidural for L&D*  Patient Location: PACU and Labor and Delivery  Anesthesia Type: Epidural  Level of Consciousness: awake, alert  and oriented  Airway and Oxygen Therapy: Patient Spontanous Breathing  Post-op Pain: none  Post-op Assessment: Post-op Vital signs reviewed, Patient's Cardiovascular Status Stable, Respiratory Function Stable, Patent Airway, No signs of Nausea or vomiting, No headache, No backache, No residual numbness and No residual motor weakness  Post-op Vital Signs: Reviewed and stable  Complications: No apparent anesthesia complications

## 2010-08-25 NOTE — Anesthesia Procedure Notes (Addendum)
Epidural Patient location during procedure: OB Start time: 08/25/2010 9:12 AM  Staffing Anesthesiologist: Lenah Messenger A. Performed by: anesthesiologist   Preanesthetic Checklist Completed: patient identified, site marked, surgical consent, pre-op evaluation, timeout performed, IV checked, risks and benefits discussed and monitors and equipment checked  Epidural Patient position: sitting Prep: DuraPrep Patient monitoring: continuous pulse ox and blood pressure Approach: midline Injection technique: LOR air  Needle:  Needle type: Tuohy  Needle gauge: 17 G Needle length: 9 cm Needle insertion depth: 4 cm Catheter type: closed end flexible Catheter size: 19 Gauge Catheter at skin depth: 9 cm Test dose: negative and 1.5% lidocaine  Assessment Events: blood not aspirated, injection not painful, no injection resistance, negative IV test and no paresthesia  Additional Notes Patient is more comfortable after epidural dosed. Please see RN's note for documentation of vital signs and FHR which are stable.

## 2010-08-25 NOTE — Progress Notes (Signed)
Pt G2 P0 at 38.3wks, having contractions and leaking clear/pinkish fluid since 0330.  Pt has hx of PTL at 31wks.

## 2010-08-25 NOTE — Progress Notes (Signed)
  Haley [name] is a 25 y.o. G2P0010 at [redacted]w[redacted]d admitted for active labor  Subjective:   Objective: BP 117/91  Pulse 93  Temp(Src) 98.2 F (36.8 C) (Oral)  Resp 18  Ht 5\' 1"  (1.549 m)  Wt 58.333 kg (128 lb 9.6 oz)  BMI 24.30 kg/m2  SpO2 98%  LMP 12/02/2009      FHT:  FHR: 120 bpm, variability: moderate,  accelerations:  Present,  decelerations:  Absent UC:   regular, every 1-3 minutes SVE:   Dilation: Lip/rim Effacement (%): 100 Station: 0 Exam by:: [employer]: Lab Results  Component Value Date   WBC 11.0* 08/25/2010   HGB 13.4 08/25/2010   HCT 38.7 08/25/2010   MCV 96.8 08/25/2010   PLT 196 08/25/2010    Assessment / Plan: Spontaneous labor, progressing normally  Labor: Progressing normally AROM Mec Fetal Wellbeing:  Category I Pain Control:  Epidural I/D:  n/a Anticipated MOD:  NSVD  Verlan Grotz E. 08/25/2010, 10:34 AM

## 2010-08-25 NOTE — Anesthesia Preprocedure Evaluation (Signed)
Anesthesia Evaluation  Name, MR# and DOB Patient awake  General Assessment Comment  Reviewed: Allergy & Precautions, H&P  and Patient's Chart, lab work & pertinent test results  History of Anesthesia Complications (+) PONV  Airway Mallampati: III TM Distance: >3 FB Neck ROM: Full    Dental No notable dental hx (+) Teeth Intact   Pulmonaryneg pulmonary ROS    clear to auscultation  pulmonary exam normal   Cardiovascular Regular Normal   Neuro/PsychNegative Neurological ROS Negative Psych ROS  GI/Hepatic/Renal negative GI ROS, negative Liver ROS, and negative Renal ROS (+)       Endo/Other  Negative Endocrine ROS (+)   Abdominal   Musculoskeletal  Hematology negative hematology ROS (+)   Peds  Reproductive/Obstetrics (+) Pregnancy   Anesthesia Other Findings             Anesthesia Physical Anesthesia Plan  ASA: II  Anesthesia Plan: Epidural   Post-op Pain Management:    Induction:   Airway Management Planned:   Additional Equipment:   Intra-op Plan:   Post-operative Plan:   Informed Consent: I have reviewed the patients History and Physical, chart, labs and discussed the procedure including the risks, benefits and alternatives for the proposed anesthesia with the patient or authorized representative who has indicated his/her understanding and acceptance.     Plan Discussed with: Anesthesiologist  Anesthesia Plan Comments:         Anesthesia Quick Evaluation

## 2010-08-25 NOTE — H&P (Signed)
  Subjective:  Haley [name] is a 25 y.o. G2 P34 female with [facility] 09/05/2010 at 38.3wga who is being admitted for active labor.  Her current obstetrical history is significant for preterm labor at 31wga for which she was given Procardia.  Patient reports increased frequency and intensity in contractions.   Fetal Movement: normal.     Objective:  Pt in pain during contractions.  Vital signs in last 24 hours: Temp:  [98 F (36.7 C)] 98 F (36.7 C) (07/18 0552) Pulse Rate:  [71] 71  (07/18 0552) Resp:  [20] 20  (07/18 0552) BP: (134)/(88) 134/88 mmHg (07/18 0552) SpO2:  [98 %] 98 % (07/18 0552) Weight:  [128 lb 9.6 oz (58.333 kg)] 128 lb 9.6 oz (58.333 kg) (07/18 0552)   General:   alert and cooperative  Skin:   normal  HEENT:  PERRLA  Lungs:   clear to auscultation bilaterally  Heart:   regular rate and rhythm, S1, S2 normal, no murmur, click, rub or gallop  Breasts:   deferred  Abdomen:  gravid  Pelvis:  Exam deferred.  FHT:  130 BPM  Uterine Size: NA  Presentations: cephalic  Cervix:    Dilation: 3.5   Effacement: 95   Station:  -1   Consistency: soft   Position: anterior   Lab Review  O pos, antibody neg, RPR NR, Rubella immune, GBS neg  One hour GTT: 58   Assessment/Plan:  38 and 3/[redacted] weeks gestation. Pt in first stage of labor, contracting.  Obstetrical history significant for preterm contractions at 31wga.     Risks, benefits, alternatives and possible complications have been discussed in detail with the patient.  Pre-admission, admission, and post admission procedures and expectations were discussed in detail.  All questions answered, all appropriate consents will be signed at the Hospital. Admission is planned for today.  Expectant management.

## 2010-08-25 NOTE — Progress Notes (Signed)
  Delivery Note At 1138  a viable female was delivered via  (Presentation:OA to [facility]  ).  Compound presentation: hand at right cheek. Cord was clamped and cut and infant was placed on mother's abdomen.  Cord blood was sampled. APGAR:8/9 ; weight 6#5.   Placenta status: intact by Veatrice Kells, 3VC .  Good uterine firming with fundal massage and pitocin.   Anesthesia:  Epidural  Episiotomy: None Lacerations: None, Intact perineum  Est. Blood Loss (mL):350  Mom to postpartum.  Baby to nursery-stable.  Kenyatta Gloeckner E. 08/25/2010, 11:53 AM

## 2010-08-25 NOTE — Progress Notes (Signed)
Mom just fed.  Reports right breast flat nipple.  Shells and hand pump given; demonstrated use.  Visitors in to see mom.  Mom to call for assistance.

## 2010-08-25 NOTE — Progress Notes (Signed)
Report called to C.Hayes,RN for transfer of care to [facility]

## 2010-08-26 NOTE — Progress Notes (Signed)
PATIENT STATES BABY IS NURSING VERY WELL.  BABY JUST FINISHED A FEEDING PRIOR TO VISIT.  PATIENT AND FOB ASKING GOOD QUESTIONS.  QUESTIONS ANSWERED AND BASICS REVIEWED.  ENCOURAGED TO CALL WITH QUESTIONS/CONCERNS.

## 2010-08-26 NOTE — Progress Notes (Signed)
Post Partum Day 1 Subjective: no complaints and up ad lib; breastfeeding going well; undecided re contraception:options rev'd  Objective: Blood pressure 108/73, pulse 59, temperature 98.2 F (36.8 C), temperature source Oral, resp. rate 18, height 5\' 1"  (1.549 m), weight 58.333 kg (128 lb 9.6 oz), last menstrual period 12/02/2009, SpO2 99.00%, unknown if currently breastfeeding.  Physical Exam:  General: alert Lochia: appropriate Uterine Fundus: firm DVT Evaluation: No evidence of DVT seen on physical exam.   Basename 08/25/10 0701  HGB 13.4  HCT 38.7    Assessment/Plan: Plan for discharge tomorrow   LOS: 1 day   SHAW,KIMBERLY B 08/26/2010, 7:10 AM

## 2010-08-26 NOTE — Addendum Note (Signed)
Addendum  created 08/26/10 0136 by Mykaela Arena A. Emmersyn Kratzke   Modules edited:Anesthesia Events    

## 2010-08-26 NOTE — Discharge Summary (Signed)
NAMEMARGRETT, KALB              ACCOUNT NO.:  [id number]  MEDICAL RECORD NO.:  [id number]  LOCATION:  9153                          FACILITY:  [facility]  PHYSICIAN:  Maryelizabeth Kaufmann, MD  DATE OF BIRTH:  14-May-1985  DATE OF ADMISSION:  07/12/2010 DATE OF DISCHARGE:  07/14/2010                              DISCHARGE SUMMARY   ADMISSION DIAGNOSES: 1. Intrauterine pregnancy at 31 weeks and 4 days. 2. Threatened preterm labor.  DISCHARGE DIAGNOSES: 1. Intrauterine pregnancy at 31 weeks and 6 days. 2. Threatened preterm labor, resolved.  FELLOW:  Maryelizabeth Kaufmann, MD  DISCHARGE MEDICATIONS:  Procardia XL 30 mg twice daily.  PROCEDURES:  None.  CONSULTANTS:  None.  HISTORY OF PRESENT ILLNESS:  This is a 25 year old gravida 2, para 0-0-1- 0 with intrauterine pregnancy at 31 weeks and 4 days who presented with threatened preterm labor with contractions, not associated with any bleeding or spotting, any leakage of fluid, and the patient was subsequently admitted.  The patient was started on magnesium.  She was subsequently transitioned over to p.o. tocolysis.  Her contractions ceased.  She continued to have some mild irritability, occasional 1-2 contractions in an hour.  Fetal heart tracing remained reassuring.  She did receive betamethasone x2, and she was discharged home in stable condition.  Her cervix was fingertip 15 and -3.  DISPOSITION:  Discharged to home.  DISCHARGE CONDITION:  Stable.  FOLLOWUP:  The patient is to follow up in clinic in about 1 week for continuity of care.  ER WARNINGS:  The patient will return to the emergency department if any fever, chills, nausea, vomiting, any signs or symptoms of preterm labor suggestive of contractions, bleeding, spotting, rupture of membranes, decreased fetal movement or any other concerning symptoms.          ______________________________ Maryelizabeth Kaufmann, MD     LC/MEDQ  D:  08/25/2010  T:  08/26/2010  Job:   [id number]

## 2010-08-26 NOTE — Addendum Note (Signed)
Addendum  created 08/26/10 0136 by Tyrone Apple. Haley [name]   Modules edited:Anesthesia Events

## 2010-08-27 MED ORDER — FERROUS SULFATE 325 (65 FE) MG PO TABS: 325.0000 mg | ORAL_TABLET | Freq: Every day | ORAL | Status: DC

## 2010-08-27 MED ORDER — IBUPROFEN 600 MG PO TABS: 600.0000 mg | ORAL_TABLET | Freq: Four times a day (QID) | ORAL | Status: AC

## 2010-08-27 MED ORDER — LANOLIN HYDROUS EX OINT: 1.0000 "application " | TOPICAL_OINTMENT | CUTANEOUS | Status: DC | PRN

## 2010-08-27 MED ORDER — DOCUSATE SODIUM 100 MG PO CAPS: 100.0000 mg | ORAL_CAPSULE | Freq: Two times a day (BID) | ORAL | Status: DC

## 2010-08-27 NOTE — Discharge Summary (Signed)
  Obstetric Discharge Summary Reason for Admission: onset of labor Prenatal Procedures: ultrasound Intrapartum Procedures: AROM with meconium Postpartum Procedures: none Complications-Operative and Postpartum: none  Hemoglobin  Date Value Range Status  08/25/2010 13.4  12.0-15.0 (g/dL) Final     HCT  Date Value Range Status  08/25/2010 38.7  36.0-46.0 (%) Final    Discharge Diagnoses: Term Pregnancy-delivered  Discharge Information: Date: 08/27/2010 Activity: pelvic rest Diet: routine Medications: Ibuprophen, Colace and Iron Condition: stable Instructions: refer to practice specific booklet Discharge to: home Follow-up Information    Follow up with [facility] in 5 weeks.   Contact information:   [address] [place] [address]          Newborn Data: Live born  Information for the patient's newborn:  Kadeshia, Kasparian [[id number]]  female ; APGAR  8/9, ; weight 6 lb 5.04;  Home with mother.  Marena Chancy 08/27/2010, 9:45 AM

## 2010-08-27 NOTE — Progress Notes (Signed)
Latch was not seen. Mother denies any soreness and describes good latch. She has wic and plans to call today. Reviewed pre pumping and br compression.

## 2010-08-27 NOTE — Progress Notes (Signed)
Post Partum Day 2 Subjective: no complaints, up ad lib, voiding, tolerating PO and breastfeeding. Uncure about contraception.   Objective: Blood pressure 109/80, pulse 73, temperature 100.2 F (37.9 C), temperature source Oral, resp. rate 18, height 5\' 1"  (1.549 m), weight 128 lb 9.6 oz (58.333 kg), last menstrual period 12/02/2009, SpO2 99.00%, unknown if currently breastfeeding.  Physical Exam:  General: alert, cooperative and no distress Lochia: appropriate Uterine Fundus: firm, at 1cm below umbilicus Incision: na DVT Evaluation: No evidence of DVT seen on physical exam. Negative Homan's sign.   Basename 08/25/10 0701  HGB 13.4  HCT 38.7    Assessment/Plan: Discharge home, Breastfeeding and Contraception unsure about contraception. Pt counseled about importance of contraception in postpartum period. Discharge with colace, ibuprofen and iron.   LOS: 2 days   Haley [name] 08/27/2010, 9:38 AM

## 2010-10-07 ENCOUNTER — Encounter: Payer: Self-pay | Admitting: *Deleted

## 2010-10-08 ENCOUNTER — Ambulatory Visit: Payer: Medicaid Other

## 2010-10-08 ENCOUNTER — Ambulatory Visit (INDEPENDENT_AMBULATORY_CARE_PROVIDER_SITE_OTHER): Payer: BC Managed Care – PPO | Admitting: Family

## 2010-10-08 NOTE — Progress Notes (Signed)
  Subjective:     Haley [name] is a 25 y.o. female who presents for a postpartum visit. She is 6 weeks postpartum following a spontaneous vaginal delivery. I have fully reviewed the prenatal and intrapartum course. The delivery was at term. Outcome: spontaneous vaginal delivery.  Postpartum course has been unremarkable. Baby's course has been normal. Baby is feeding by both breast and bottle. Bleeding no bleeding. Bowel function is normal. Bladder function is normal. Patient is sexually active. Contraception method is condoms. Postpartum depression screening: negative.  Pt desires Mirena for birth control method.  Last pap smear in Feb 2012.  Review of Systems Pertinent items are noted in HPI.   Objective:    Breastfeeding? Unknown  General:  alert, cooperative and appears stated age   Breasts:  negative  Lungs: clear to auscultation bilaterally  Heart:  regular rate and rhythm, S1, S2 normal, no murmur, click, rub or gallop  Abdomen: soft, non-tender; bowel sounds normal; no masses,  no organomegaly; uterus involuted.                          Assessment:    Normal postpartum exam. Pap smear not done at today's visit.   Plan:    1. Contraception: Condoms and return for IUD insertion (Mirena) in one week. 2. Pap in Feb 2013 3. Follow up in: 1 week or as needed.

## 2010-10-21 ENCOUNTER — Ambulatory Visit (INDEPENDENT_AMBULATORY_CARE_PROVIDER_SITE_OTHER): Payer: BC Managed Care – PPO | Admitting: Physician Assistant

## 2010-10-21 DIAGNOSIS — Z309 Encounter for contraceptive management, unspecified: Secondary | ICD-10-CM

## 2010-10-21 MED ORDER — MISOPROSTOL 200 MCG PO TABS
200.0000 ug | ORAL_TABLET | Freq: Once | ORAL | Status: DC
Start: 2010-10-21 — End: 2011-06-27

## 2010-10-29 ENCOUNTER — Ambulatory Visit (INDEPENDENT_AMBULATORY_CARE_PROVIDER_SITE_OTHER): Payer: BC Managed Care – PPO | Admitting: Advanced Practice Midwife

## 2010-10-29 ENCOUNTER — Ambulatory Visit: Payer: BC Managed Care – PPO

## 2010-10-29 VITALS — BP 110/67 | HR 60 | Temp 98.4°F | Resp 16 | Ht 60.0 in | Wt 109.0 lb

## 2010-10-29 DIAGNOSIS — Z30430 Encounter for insertion of intrauterine contraceptive device: Secondary | ICD-10-CM

## 2010-10-29 DIAGNOSIS — IMO0001 Reserved for inherently not codable concepts without codable children: Secondary | ICD-10-CM

## 2010-10-29 DIAGNOSIS — Z3009 Encounter for other general counseling and advice on contraception: Secondary | ICD-10-CM

## 2010-10-29 MED ORDER — LEVONORGESTREL 20 MCG/24HR IU IUD: 1.0000 | INTRAUTERINE_SYSTEM | Freq: Once | INTRAUTERINE | Status: DC

## 2010-10-29 MED ORDER — LEVONORGESTREL 20 MCG/24HR IU IUD
INTRAUTERINE_SYSTEM | Freq: Once | INTRAUTERINE | Status: AC
  Administered 2010-10-29: 11:00:00 via INTRAUTERINE

## 2010-10-29 NOTE — Patient Instructions (Signed)
What is this medicine? Haley [name] (LEE voe nor jes trel) is a contraceptive (birth control) device. It is used to prevent pregnancy and to treat heavy bleeding that occurs during your period. It can be used for up to 5 years. This medicine may be used for other purposes; ask your health care provider or pharmacist if you have questions.   What should I tell my health care provider before I take this medicine? They need to know if you have any of these conditions: -abnormal Pap smear -cancer of the breast, uterus, or cervix -diabetes -endometritis -genital or pelvic infection now or in the past -have more than one sexual partner or your partner has more than one partner -heart disease -history of an ectopic or tubal pregnancy -immune system problems -[name] in place -liver disease or tumor -problems with blood clots or take blood-thinners -use intravenous drugs -uterus of unusual shape -vaginal bleeding that has not been explained -an unusual or allergic reaction to Haley, other hormones, silicone, or polyethylene, medicines, foods, dyes, or preservatives -pregnant or trying to get pregnant -breast-feeding   How should I use this medicine? This device is placed inside the uterus by a health care professional. Talk to your pediatrician regarding the use of this medicine in children. Special care may be needed. Overdosage: If you think you have taken too much of this medicine contact a poison control center or emergency room at once. NOTE: This medicine is only for you. Do not share this medicine with others.   What if I miss a dose? This does not apply.   What may interact with this medicine? Do not take this medicine with any of the following medications: -amprenavir -bosentan -fosamprenavir   This medicine may also interact with the following medications: -aprepitant -barbiturate medicines for inducing sleep or treating  seizures -bexarotene -griseofulvin -medicines to treat seizures like carbamazepine, ethotoin, felbamate, oxcarbazepine, phenytoin, topiramate -modafinil -pioglitazone -rifabutin -rifampin -rifapentine -some medicines to treat HIV infection like atazanavir, indinavir, lopinavir, nelfinavir, tipranavir, ritonavir -St. John's wort -warfarin   This list may not describe all possible interactions. Give your health care provider a list of all the medicines, herbs, non-prescription drugs, or dietary supplements you use. Also tell them if you smoke, drink alcohol, or use illegal drugs. Some items may interact with your medicine.   What should I watch for while using this medicine? Visit your doctor or health care professional for regular check ups. See your doctor if you or your partner has sexual contact with others, becomes HIV positive, or gets a sexual transmitted disease.   This product does not protect you against HIV infection (AIDS) or other sexually transmitted diseases.   You can check the placement of the [name] yourself by reaching up to the top of your vagina with clean fingers to feel the threads. Do not pull on the threads. It is a good habit to check placement after each menstrual period. Call your doctor right away if you feel more of the [name] than just the threads or if you cannot feel the threads at all.   The [name] may come out by itself. You may become pregnant if the device comes out. If you notice that the [name] has come out use a backup birth control method like condoms and call your health care provider.   Using tampons will not change the position of the [name] and are okay to use during your period.   What side effects may I notice from receiving this  medicine? Side effects that you should report to your doctor or health care professional as soon as possible: -allergic reactions like skin rash, itching or hives, swelling of the face, lips, or tongue -fever, flu-like  symptoms -genital sores -high blood pressure -no menstrual period for 6 weeks during use -pain, swelling, warmth in the leg -pelvic pain or tenderness -severe or sudden headache -signs of pregnancy -stomach cramping -sudden shortness of breath -trouble with balance, talking, or walking -unusual vaginal bleeding, discharge -yellowing of the eyes or skin   Side effects that usually do not require medical attention (report to your doctor or health care professional if they continue or are bothersome): -acne -breast pain -change in sex drive or performance -changes in weight -cramping, dizziness, or faintness while the device is being inserted -headache -irregular menstrual bleeding within first 3 to 6 months of use -nausea   This list may not describe all possible side effects. Call your doctor for medical advice about side effects. You may report side effects to FDA at 1-800-FDA-1088.   Where should I keep my medicine? This does not apply.   NOTE:This sheet is a summary. It may not cover all possible information. If you have questions about this medicine, talk to your doctor, pharmacist, or health care provider.      2011, Elsevier/Gold Standard.

## 2010-11-12 ENCOUNTER — Ambulatory Visit (INDEPENDENT_AMBULATORY_CARE_PROVIDER_SITE_OTHER): Payer: BC Managed Care – PPO | Admitting: Family

## 2010-11-12 VITALS — BP 120/70 | HR 57 | Temp 98.5°F | Resp 16 | Ht 60.0 in | Wt 107.0 lb

## 2010-11-12 DIAGNOSIS — Z30431 Encounter for routine checking of intrauterine contraceptive device: Secondary | ICD-10-CM

## 2010-11-12 NOTE — Progress Notes (Signed)
  Subjective:    Patient ID: Haley [name], female    DOB: Oct 24, 1985, 25 y.o.   MRN: [id number]  HPI Pt is here s/p IUD insertion with report of partner feeling strings with intercourse.  No other problems or concerns.   Review of Systems Negative     Objective:   Physical Exam  Genitourinary: Cervix exhibits no motion tenderness and no discharge.       IUD strings seen protruding from os; not enough to cut; tucked gently in cervical os to try to decrease partner sensation          Assessment & Plan:  IUD Check  Plan: F/U as needed  [facility]

## 2010-11-12 NOTE — Patient Instructions (Signed)
What is this medicine? TOLNAFTATE (tole NAF tate) is an antifungal medicine. It is used to treat certain kinds of fungal or yeast infections of the skin. This medicine may be used for other purposes; ask your health care provider or pharmacist if you have questions.   What should I tell my health care provider before I take this medicine? They need to know if you have any of these conditions: -diabetes -immune system problems -peripheral vascular disease -an unusual or allergic reaction to tolnaftate, other medicines, foods, dyes, or preservatives -pregnant or trying to get pregnant -breast-feeding   How should I use this medicine? This medicine is for external use only. Do not take by mouth. Avoid getting in the eyes or nose. Follow the directions on the label. Wash hands before and after use. If treating hands, only wash hands before use. Cleanse and dry affected area thoroughly. Apply a thin layer of the product to cover the affected skin and surrounding area. You can cover the area with a sterile gauze dressing (bandage). Do not use an airtight bandage (such as a plastic-covered bandage). Use in the morning and evening or as directed by your doctor or health care professional. Use this medicine for the full amount of time recommended on the package or by your doctor or health care professional even if you begin to feel better. Do not use for more than 4 weeks without advice.   Talk to your pediatrician regarding the use of this medicine in children. While this medicine may be prescribed for children as young as 2 years for selected conditions, precautions do apply.   Overdosage: If you think you have taken too much of this medicine contact a poison control center or emergency room at once. NOTE: This medicine is only for you. Do not share this medicine with others.   What if I miss a dose? If you miss a dose, use it as soon as you can. If it is almost time for your next dose, use only  that dose. Do not use double or extra doses.   What may interact with this medicine? Interactions are not expected. Do not use any other skin products on the affected area without asking your doctor or health care professional.   This list may not describe all possible interactions. Give your health care provider a list of all the medicines, herbs, non-prescription drugs, or dietary supplements you use. Also tell them if you smoke, drink alcohol, or use illegal drugs. Some items may interact with your medicine.   What should I watch for while using this medicine? Tell your doctor or healthcare professional if your symptoms do not start to get better or if they get worse. You may have a skin infection that does not respond to this medicine.   If you are using this medicine for 'jock itch' be sure to dry the groin completely after bathing. Do not wear underwear that is tight-fitting or made from synthetic fibers like rayon or nylon. Wear loose-fitting, cotton underwear.   If you are using this medicine for athlete's foot be sure to dry your feet carefully after bathing, especially between the toes. Do not wear socks made from wool or synthetic materials like rayon or nylon. Wear clean cotton socks and change them at least once a day, change them more if your feet sweat a lot. Also, try to wear sandals or shoes that are well-ventilated.   What side effects may I notice from receiving this medicine? Side  effects that you should report to your doctor or health care professional as soon as possible: -allergic reactions like skin rash, itching or hives, swelling of the face, lips, or tongue -increased inflammation, redness, or pain at the affected area   Side effects that usually do not require medical attention (report to your doctor or health care professional if they continue or are bothersome): -dry skin -mild skin irritation, burning, or itching at the affected area   This list may not describe  all possible side effects. Call your doctor for medical advice about side effects. You may report side effects to FDA at 1-800-FDA-1088.   Where should I keep my medicine? Keep out of the reach of children.   Store at room temperature between 15 and 30 degrees C (59 and 86 degrees F). Do not freeze. Throw away any unused medicine after the expiration date. Some gels, solutions or sprays may contain alcohol or have other flammable components. Do not use while smoking or near heat or flame.   NOTE:This sheet is a summary. It may not cover all possible information. If you have questions about this medicine, talk to your doctor, pharmacist, or health care provider.      2011, Elsevier/Gold Standard.

## 2010-11-17 NOTE — Progress Notes (Signed)
IUD Insertion Procedure Note  Pre-operative Diagnosis: Undesired fertility   Post-operative Diagnosis: same  Indications: contraception  Procedure Details  Urine pregnancy test was done today and result was negative.  The risks (including infection, bleeding, pain, and uterine perforation) and benefits of the procedure were explained to the patient and Written informed consent was obtained.    The pt was premedicated w/ Cytotec to aid in insertion of the sound. Cervix cleansed with Betadine. Uterus sounded to 7 cm w/out difficulty. IUD inserted without difficulty. String visible and trimmed. Patient tolerated procedure well.  IUD Information: Mirena.  Condition: Stable  Complications: None  Plan:  The patient was advised to call for any fever or for prolonged or severe pain or bleeding. She was advised to use OTC ibuprofen as needed for mild to moderate pain.   Attending Physician Documentation: I was present for or participated in the entire procedure, including opening and closing.  Dorathy Kinsman 10/29/2010 2:45 PM

## 2010-12-10 ENCOUNTER — Ambulatory Visit (INDEPENDENT_AMBULATORY_CARE_PROVIDER_SITE_OTHER): Payer: BC Managed Care – PPO | Admitting: Advanced Practice Midwife

## 2010-12-10 ENCOUNTER — Encounter: Payer: Self-pay | Admitting: Advanced Practice Midwife

## 2010-12-10 VITALS — BP 113/65 | HR 60 | Temp 98.6°F | Resp 16 | Ht 61.0 in | Wt 108.0 lb

## 2010-12-10 DIAGNOSIS — Z30431 Encounter for routine checking of intrauterine contraceptive device: Secondary | ICD-10-CM

## 2010-12-10 NOTE — Progress Notes (Signed)
  Subjective:    Patient ID: Haley [name], female    DOB: [birth year], 25 y.o.   MRN: [id number]  HPI Presents for IUD string check.  Had Mirena IUD inserted 10/29/10 without problems. Is also s/p SVD in August. No problems except some dyspareunia with intercourse.  Baby is doing well.    Review of Systems As above Has spotting    Objective:   Physical Exam  Speculum:  Strings visible from closed cervix. Strings very short. No CMT. Uterus well involuted and nontender. Pt states pelvic exam uncomfortable, but tenderness not appreciated during bimanual.      Assessment & Plan:  A:  Normal placement of IUD  Discussed discomfort may be due to recent postpartum status.  If does not resolve over the next few weeks, may return here for eval. May need [place] at that point.

## 2011-01-11 ENCOUNTER — Other Ambulatory Visit: Payer: BC Managed Care – PPO

## 2011-02-08 NOTE — L&D Delivery Note (Signed)
Delivery Note  Tanya Crothers is a 26 y.o. G3P1011 who presented at [redacted] wk gestation in active labor dilated to 5.5 cm. She was augmented with pitocin, and membranes were artificially ruptured at 7 cm. She progressed quickly to complete dilation after AROM without epidural.  At 1:21 AM a viable female was delivered via  (Presentation: LOA).  APGAR: pending documentation; weight pending.   Placenta status: intact, spontaneous.  Cord: 3 vessels with the following complications: none.  Cord pH: not drawn  Anesthesia:  Immediately prior to delivery, approx 10 mL of Lidocaine was injected into the perineal area Episiotomy: n/a Lacerations: none Suture Repair: n/a Est. Blood Loss (mL): 250  Mom to postpartum.  Baby to nursery-stable.  Dr. Thad Ranger was present for the entire delivery.  Street, Christopher 02/06/2012, 1:35 AM  I was present for delivery and agree with above note. NSVD with no complications. Napoleon Form, MD 02/06/2012 1:51 AM

## 2011-03-29 ENCOUNTER — Ambulatory Visit (INDEPENDENT_AMBULATORY_CARE_PROVIDER_SITE_OTHER): Payer: BC Managed Care – PPO | Admitting: Nurse Practitioner

## 2011-03-29 ENCOUNTER — Encounter: Payer: Self-pay | Admitting: Obstetrics and Gynecology

## 2011-03-29 VITALS — BP 123/78 | HR 81 | Ht 60.0 in | Wt 108.0 lb

## 2011-03-29 DIAGNOSIS — Z975 Presence of (intrauterine) contraceptive device: Secondary | ICD-10-CM

## 2011-03-29 NOTE — Progress Notes (Signed)
S: Pt in today for Mirenia IUD removal. Since insertion she has had BTB and cramps. She is not currently sexually active. She does not plan to be active until June when she marries. At that time she will consider Nuvaring.  O: genitalia external negative Vagina: vault minimal amount blood Cervix, strings seen and IUD pulled with no difficulties Biman Neg for CMT/ adnexal mass or tenderness  A: Contraception  P: Mirenia IUD pulled today Discussed other options for birth control RTC when near wedding date for birth control or prn

## 2011-03-29 NOTE — Patient Instructions (Signed)
Contraceptive Devices (IUD) IUD stands for intrauterine device. Intrauterine means inside the womb (uterus). The purpose of the IUD is to prevent pregnancy. IUDs make it more difficult for your partner's sperm to get into your womb and into your fallopian tubes, where the eggs are fertilized. IUDs also alter the secretions of your cervix, which make it a stronger sperm barrier. They also affect the lining of the womb, so it is harder for an egg to implant. The IUD does not cause an abortion. There are 2 types of IUDs available:  Copper IUD gives off a small amount of copper inside the uterus. This prevents the sperm from going through the uterus, up into the fallopian tube, where the egg is fertilized. The copper IUD can also damage or prevent the fertilized egg from attaching on the inside lining of the uterus. It can stay in place for 10 years. The copper IUD can be used as an emergency contraceptive, if inserted within 5 days after having unprotected sexual intercourse.   Hormone IUD contains progestin (synthetic progesterone), and it releases this hormone into the uterus. The hormone thickens the mucus on the cervix and prevents sperm from entering the uterus. It also weakens the sperm that get into the uterus, so that the sperm and egg cannot live in the fallopian tube. It also makes the inside lining of the uterus thinner, which makes it difficult for a fertilized egg to attach to the uterus. The hormone progestin in the IUD decreases the amount of bleeding during a menstrual period and can be helpful in women who have heavy menstrual periods. The hormone IUD can stay in place for 5 years.  SIDE EFFECTS OF THE IUD:  There may be more cramping or pain with periods.   It may cause heavier, longer periods, which can cause lack of red blood cells (anemia) and can interfere with your daily and sexual activities.  This method of birth control is not usually the best choice for a woman with heavy or  prolonged periods. The birth control pill may be a better choice. IUDs work best for women who have already had a pregnancy, because the cervix is more open, making the insertion of the device easier and less painful. However, many women without children use the IUD. One of the main goals of patient selection is to prevent unintentionally inserting an IUD into a patient who has an STD (sexually transmitted disease), who is at high risk of exposure to an STD, or who is already pregnant. That is why the IUD is inserted during, or right after, a menstrual period. REASONS NOT TO USE AN IUD:  The womb or cervix is not shaped normally.   You have or have had a pelvic infection, such as an STD, in the past 3 months.   You have or suspect cancer in the female organs.   You have an abnormal Pap smear.   You have certain liver diseases.   There is severe infection or inflammation of the cervix (cervicitis).   You have unexplained vaginal bleeding.   You have heart valve problems (unless a heart specialist advises otherwise).   You are allergic to copper (rare).   You previously had a pregnancy outside the uterus (ectopic).   You are pregnant or suspect you are pregnant.   You have prolonged or heavy periods, or heavy pain or cramping with periods (except for the hormone IUD).   You have or suspect pelvic cancer.   You have an   STD.   The cervix or uterus has problems (cervical stenosis, fibroids in the uterus) making it difficult to insert the IUD.  IUDs should be removed when a woman becomes menopausal or pregnant. BENEFITS OF THE IUD:  You are always ready to have sexual intercourse.   The copper IUD does not interfere with your female hormones.   The copper IUD can be used as emergency contraception.   An IUD can be used while nursing.   It works immediately after insertion, and there is no problem getting pregnant when it is removed.   It does not interfere with foreplay.    The progesterone IUD can make heavy menstrual periods lighter.   The progesterone IUD can be used for 5 years.   The copper IUD can be used for 10 years.  RISKS AND COMPLICATIONS  Putting the IUD through the uterus, into the pelvis or abdomen (perforation of the uterus).   Losing the IUD (expulsion). This is more common in women who never had children.   When pregnant with an IUD, there is an increased chance of an infection and loss of the pregnancy.   Pregnancy in the fallopian tube (ectopic).   STD, in women who have more than 1 sex partner. The IUD does not protect against STDs.   Other minor side effects may include:   Headaches.   Feeling sick to your stomach (nausea).   Breast tenderness.   Depression.  PROCEDURE   The IUD is inserted during or right after a menstrual period, to make sure you are not pregnant.   You will lie on an exam table, naked from the waist down.   Your caregiver will do an exam to determine the size and position of your uterus.   Usually, an anesthetic is not needed.   Your caregiver may give you a pain pill to take, 1 or 2 hours before the procedure.   Sometimes, a paracervical block may be used to block and control any discomfort with insertion.   A tool (speculum) is then placed in your vagina (birth canal) so your caregiver can see the cervix.   A sound is sent into the uterus to check the depth of the uterus.   A slim instrument (IUD inserter), which is shaped like a drinking straw, is inserted through the small opening in your cervix and into your uterus.   Then, the IUD is pushed in with a plunger, much like a syringe, and the inserter is removed. There may be some cramping and pain during the insertion.   Relaxing helps to lessen the discomfort.   Following the procedure, you will usually spot blood. Have some pads with you. Avoid using tampons for 2 weeks. Bleeding after the procedure is normal. It varies from light  spotting for a few days to menstrual-like bleeding for up to 3 weeks. It may be like a period if it is near the time for your normal period.   You may also have mild cramping. Only take over-the-counter or prescription medicines for pain, discomfort, or fever as directed by your caregiver. Do not use aspirin, as this may increase bleeding.   Practice checking the string coming out of the cervix, to make sure the IUD is always in the uterus.  HOME CARE INSTRUCTIONS   Do not drive for 24 hours.   Only take over-the-counter or prescription medicines for pain, discomfort, or fever as directed by your caregiver.   No tampons, douching, or sexual intercourse for   2 weeks, or until your caregiver approves.   Rest at home for 24 hours. You may then resume normal activities, unless told otherwise by your caregiver.   Check your IUD prior to resuming sexual activity, to make sure it is in place. Make sure that you can feel the strings. An IUD can be pushed out and lost without the user even knowing it is gone. Also, if the strings are getting longer, it may mean that the IUD is being forced out of the uterus. This means it is no longer offering full protection from pregnancy.   Take any medications your caregiver has ordered, as directed.   Make sure to keep your recheck appointment, so your caregiver can make sure your IUD has remained in place. After that exam, yearly exams are advised, unless you cannot feel the strings of your IUD.   Check that the IUD is still in place by feeling for the strings after every menstrual period.  SEEK MEDICAL CARE IF:   Bleeding is heavier than a normal menstrual cycle.   You have an oral temperature above 102 F (38.9 C).   You have increasing cramps or pains, not relieved with medicine. Or you develop belly (abdominal) pain that does not seem to be related to the same area of earlier cramping and pain.   You are lightheaded, unusually weak, or have fainting  episodes.   You develop pain in the tops of your shoulders (shoulder strap areas).   You are having problems or questions, which have not been answered well enough by your caregiver.   You develop abdominal pain.   You have pain during sexual intercourse.   You cannot feel the IUD strings.   You have abnormal vaginal discharge.   You feel the IUD at the opening of the cervix in the vagina.   You think you are pregnant.   You miss your menstrual period.   The IUD string is hurting your sex partner.   The IUD string has gotten longer.  Document Released: 12/29/2003 Document Revised: 05/11/2010 Document Reviewed: 02/09/2009 ExitCare Patient Information 2012 [employer]. 

## 2011-05-10 ENCOUNTER — Encounter: Payer: Self-pay | Admitting: Obstetrics & Gynecology

## 2011-05-10 ENCOUNTER — Ambulatory Visit (INDEPENDENT_AMBULATORY_CARE_PROVIDER_SITE_OTHER): Payer: BC Managed Care – PPO | Admitting: Obstetrics & Gynecology

## 2011-05-10 VITALS — BP 121/72 | HR 78 | Ht 60.0 in | Wt 106.0 lb

## 2011-05-10 DIAGNOSIS — B3731 Acute candidiasis of vulva and vagina: Secondary | ICD-10-CM

## 2011-05-10 DIAGNOSIS — N912 Amenorrhea, unspecified: Secondary | ICD-10-CM

## 2011-05-10 DIAGNOSIS — N390 Urinary tract infection, site not specified: Secondary | ICD-10-CM

## 2011-05-10 DIAGNOSIS — B373 Candidiasis of vulva and vagina: Secondary | ICD-10-CM

## 2011-05-10 LAB — POCT URINE PREGNANCY: Preg Test, Ur: NEGATIVE

## 2011-05-10 LAB — POCT URINALYSIS DIPSTICK
Blood, UA: NEGATIVE
Glucose, UA: NEGATIVE
Nitrite, UA: NEGATIVE
Urobilinogen, UA: 0.2

## 2011-05-10 MED ORDER — FLUCONAZOLE 150 MG PO TABS: 150.0000 mg | ORAL_TABLET | Freq: Once | ORAL | Status: AC

## 2011-05-10 NOTE — Patient Instructions (Signed)
Monilial Vaginitis Vaginitis in a soreness, swelling and redness (inflammation) of the vagina and vulva. Monilial vaginitis is not a sexually transmitted infection. CAUSES  Yeast vaginitis is caused by yeast (candida) that is normally found in your vagina. With a yeast infection, the candida has overgrown in number to a point that upsets the chemical balance. SYMPTOMS   White, thick vaginal discharge.   Swelling, itching, redness and irritation of the vagina and possibly the lips of the vagina (vulva).   Burning or painful urination.   Painful intercourse.  DIAGNOSIS  Things that may contribute to monilial vaginitis are:  Postmenopausal and virginal states.   Pregnancy.   Infections.   Being tired, sick or stressed, especially if you had monilial vaginitis in the past.   Diabetes. Good control will help lower the chance.   Birth control pills.   Tight fitting garments.   Using bubble bath, feminine sprays, douches or deodorant tampons.   Taking certain medications that kill germs (antibiotics).   Sporadic recurrence can occur if you become ill.  TREATMENT  Your caregiver will give you medication.  There are several kinds of anti monilial vaginal creams and suppositories specific for monilial vaginitis. For recurrent yeast infections, use a suppository or cream in the vagina 2 times a week, or as directed.   Anti-monilial or steroid cream for the itching or irritation of the vulva may also be used. Get your caregiver's permission.   Painting the vagina with methylene blue solution may help if the monilial cream does not work.   Eating yogurt may help prevent monilial vaginitis.  HOME CARE INSTRUCTIONS   Finish all medication as prescribed.   Do not have sex until treatment is completed or after your caregiver tells you it is okay.   Take warm sitz baths.   Do not douche.   Do not use tampons, especially scented ones.   Wear cotton underwear.   Avoid tight  pants and panty hose.   Tell your sexual partner that you have a yeast infection. They should go to their caregiver if they have symptoms such as mild rash or itching.   Your sexual partner should be treated as well if your infection is difficult to eliminate.   Practice safer sex. Use condoms.   Some vaginal medications cause latex condoms to fail. Vaginal medications that harm condoms are:   Cleocin cream.   Butoconazole (Femstat).   Terconazole (Terazol) vaginal suppository.   Miconazole (Monistat) (may be purchased over the counter).  SEEK MEDICAL CARE IF:   You have a temperature by mouth above 102 F (38.9 C).   The infection is getting worse after 2 days of treatment.   The infection is not getting better after 3 days of treatment.   You develop blisters in or around your vagina.   You develop vaginal bleeding, and it is not your menstrual period.   You have pain when you urinate.   You develop intestinal problems.   You have pain with sexual intercourse.  Document Released: 11/03/2004 Document Revised: 01/13/2011 Document Reviewed: 07/18/2008 ExitCare Patient Information 2012 [employer]. 

## 2011-05-10 NOTE — Progress Notes (Signed)
  Subjective:    Patient ID: Haley [name], female    DOB: 01/31/1986, 26 y.o.   MRN: [id number]  [id number] Patient's last menstrual period was 04/20/2011. 4 days white vaginal discharge. She suspects yeast. Viral gastroenteritis few days ago and still some nausea and loose BM. MIld left side abdominal pain.   Past Medical History  Diagnosis Date  . No pertinent past medical history    Past Surgical History  Procedure Date  . Wisdom tooth extraction   . Wisdom tooth extraction    Allergies  Allergen Reactions  . Latex     REACTION: hives  . Penicillins     REACTION: hives   History   Social History  . Marital Status: Single    Spouse Name: N/A    Number of Children: N/A  . Years of Education: N/A   Occupational History  . Not on file.   Social History Main Topics  . Smoking status: Never Smoker   . Smokeless tobacco: Never Used  . Alcohol Use: No  . Drug Use: No  . Sexually Active: Yes    Birth Control/ Protection: Other-see comments, IUD     pregnant   Other Topics Concern  . Not on file   Social History Narrative  . No narrative on file   Current outpatient prescriptions:ferrous sulfate 325 (65 FE) MG tablet, Take 1 tablet (325 mg total) by mouth daily., Disp: 30 tablet, Rfl: 3;  docusate sodium (COLACE) 100 MG capsule, Take 1 capsule (100 mg total) by mouth 2 (two) times daily., Disp: 60 capsule, Rfl: 2;  lanolin OINT, Apply 1 application topically as needed (for breast care)., Disp: 56 g, Rfl: 1 levonorgestrel (MIRENA) 20 MCG/24HR IUD, 1 each by Intrauterine route once., Disp: 1 each, Rfl: 0;  misoprostol (CYTOTEC) 200 MCG tablet, Take 1 tablet (200 mcg total) by mouth once., Disp: 1 tablet, Rfl: 0;  Prenatal Vit-Fe Psac Cmplx-FA (PRENATAL MULTIVITAMIN) 60-1 MG tablet, Take 1 tablet by mouth daily with breakfast.  , Disp: , Rfl:      Review of Systems  Constitutional: Positive for appetite change and unexpected weight change. Negative for fever and chills.   Gastrointestinal: Negative for abdominal distention.  Genitourinary: Positive for vaginal discharge. Negative for dysuria and vaginal pain.       Objective:   Physical Exam  Constitutional: No distress.       Slender, NAD  Abdominal: Soft. She exhibits no distension and no mass. There is no tenderness.  Genitourinary:       White discharge c/w yeast. Wet prep done  Skin: Skin is warm and dry.  Psychiatric: She has a normal mood and affect.          Assessment & Plan:  Yeast vaginitis, recovering from viral GI syndrome. Rx Diflucan one dose. Considering [facility]

## 2011-05-11 LAB — WET PREP, GENITAL: Yeast Wet Prep HPF POC: NONE SEEN

## 2011-06-27 ENCOUNTER — Inpatient Hospital Stay (HOSPITAL_COMMUNITY): Payer: BC Managed Care – PPO

## 2011-06-27 ENCOUNTER — Inpatient Hospital Stay (HOSPITAL_COMMUNITY)
Admission: AD | Admit: 2011-06-27 | Discharge: 2011-06-27 | Disposition: A | Discharge status: Home / Self Care | Payer: BC Managed Care – PPO | Source: Ambulatory Visit | Attending: Obstetrics & Gynecology | Admitting: Obstetrics & Gynecology

## 2011-06-27 ENCOUNTER — Encounter (HOSPITAL_COMMUNITY): Payer: Self-pay | Admitting: *Deleted

## 2011-06-27 DIAGNOSIS — O209 Hemorrhage in early pregnancy, unspecified: Secondary | ICD-10-CM | POA: Insufficient documentation

## 2011-06-27 LAB — URINALYSIS, ROUTINE W REFLEX MICROSCOPIC
Glucose, UA: NEGATIVE mg/dL
Ketones, ur: NEGATIVE mg/dL
Leukocytes, UA: NEGATIVE
Specific Gravity, Urine: 1.02 (ref 1.005–1.030)
pH: 7 (ref 5.0–8.0)

## 2011-06-27 LAB — CBC
Hemoglobin: 11.8 g/dL — ABNORMAL LOW (ref 12.0–15.0)
MCH: 30.6 pg (ref 26.0–34.0)
RBC: 3.85 MIL/uL — ABNORMAL LOW (ref 3.87–5.11)
WBC: 8 10*3/uL (ref 4.0–10.5)

## 2011-06-27 LAB — HCG, QUANTITATIVE, PREGNANCY: hCG, Beta Chain, Quant, S: 66541 m[IU]/mL — ABNORMAL HIGH (ref <5)

## 2011-06-27 LAB — ABO/RH: ABO/RH(D): O POS

## 2011-06-27 NOTE — MAU Note (Signed)
Pt does not want pelvic exam needs to pick up child from daycare.

## 2011-06-27 NOTE — MAU Provider Note (Signed)
Haley [name] y.o.G3P1011 @[redacted]w[redacted]d  by LMP Chief Complaint  Patient presents with  . Back Pain  . Vaginal Bleeding     First Provider Initiated Contact with Patient 06/27/11 1549      SUBJECTIVE  HPI: Pt presents to MAU with back pain and light vaginal bleeding on Friday.  She reports some dark brown spotting today, but no more bright red bleeding.  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  She denies abdominal pain, LOF, vaginal itching/burning, urinary symptoms, h/a, dizziness, n/v, or fever/chills.    Following U/S, pt needs to leave MAU to pick up her child.  She cannot wait for pelvic exam at this time.  She denies exposure to STDs or vaginal symptoms.  Past Medical History  Diagnosis Date  . No pertinent past medical history    Past Surgical History  Procedure Date  . Wisdom tooth extraction   . Wisdom tooth extraction    History   Social History  . Marital Status: Single    Spouse Name: N/A    Number of Children: N/A  . Years of Education: N/A   Occupational History  . Not on file.   Social History Main Topics  . Smoking status: Never Smoker   . Smokeless tobacco: Never Used  . Alcohol Use: No  . Drug Use: No  . Sexually Active: Yes    Birth Control/ Protection: Other-see comments, IUD     pregnant   Other Topics Concern  . Not on file   Social History Narrative  . No narrative on file   No current facility-administered medications on file prior to encounter.   Current Outpatient Prescriptions on File Prior to Encounter  Medication Sig Dispense Refill  . docusate sodium (COLACE) 100 MG capsule Take 1 capsule (100 mg total) by mouth 2 (two) times daily.  60 capsule  2  . ferrous sulfate 325 (65 FE) MG tablet Take 1 tablet (325 mg total) by mouth daily.  30 tablet  3  . lanolin OINT Apply 1 application topically as needed (for breast care).  56 g  1  . levonorgestrel (MIRENA) 20 MCG/24HR IUD 1 each by Intrauterine route once.  1 each  0   . misoprostol (CYTOTEC) 200 MCG tablet Take 1 tablet (200 mcg total) by mouth once.  1 tablet  0  . Prenatal Vit-Fe Psac Cmplx-FA (PRENATAL MULTIVITAMIN) 60-1 MG tablet Take 1 tablet by mouth daily with breakfast.         Allergies  Allergen Reactions  . Latex     REACTION: hives  . Penicillins     REACTION: hives    ROS: Pertinent items in HPI  OBJECTIVE Blood pressure 124/78, pulse 78, temperature 98 F (36.7 C), temperature source Oral, resp. rate 16, height 4\' 11"  (1.499 m), weight 47.991 kg (105 lb 12.8 oz), last menstrual period 05/15/2011, SpO2 100.00%, unknown if currently breastfeeding.  GENERAL: Well-developed, well-nourished female in no acute distress.  HEENT: Normocephalic, good dentition HEART: normal rate RESP: normal effort ABDOMEN: Soft, nontender EXTREMITIES: Nontender, no edema NEURO: Alert and oriented SPECULUM EXAM: Deferred  LAB RESULTS Results for orders placed during the hospital encounter of 06/27/11 (from the past 24 hour(s))  URINALYSIS, ROUTINE W REFLEX MICROSCOPIC     Status: Normal   Collection Time   06/27/11  2:30 PM      Component Value Range   Color, Urine YELLOW  YELLOW    APPearance CLEAR  CLEAR    Specific Gravity,  Urine 1.020  1.005 - 1.030    pH 7.0  5.0 - 8.0    Glucose, UA NEGATIVE  NEGATIVE (mg/dL)   Hgb urine dipstick NEGATIVE  NEGATIVE    Bilirubin Urine NEGATIVE  NEGATIVE    Ketones, ur NEGATIVE  NEGATIVE (mg/dL)   Protein, ur NEGATIVE  NEGATIVE (mg/dL)   Urobilinogen, UA 1.0  0.0 - 1.0 (mg/dL)   Nitrite NEGATIVE  NEGATIVE    Leukocytes, UA NEGATIVE  NEGATIVE   POCT PREGNANCY, URINE     Status: Abnormal   Collection Time   06/27/11  3:15 PM      Component Value Range   Preg Test, Ur POSITIVE (*) NEGATIVE   CBC     Status: Abnormal   Collection Time   06/27/11  3:26 PM      Component Value Range   WBC 8.0  4.0 - 10.5 (K/uL)   RBC 3.85 (*) 3.87 - 5.11 (MIL/uL)   Hemoglobin 11.8 (*) 12.0 - 15.0 (g/dL)   HCT 95.6 (*) 21.3  - 46.0 (%)   MCV 92.2  78.0 - 100.0 (fL)   MCH 30.6  26.0 - 34.0 (pg)   MCHC 33.2  30.0 - 36.0 (g/dL)   RDW 08.6  57.8 - 46.9 (%)   Platelets 289  150 - 400 (K/uL)  ABO/RH     Status: Normal   Collection Time   06/27/11  3:26 PM      Component Value Range   ABO/RH(D) O POS    HCG, QUANTITATIVE, PREGNANCY     Status: Abnormal   Collection Time   06/27/11  3:26 PM      Component Value Range   hCG, Beta Chain, Quant, [place] [address] (*) <5 (mIU/mL)     IMAGING   ASSESSMENT IUP [redacted]w[redacted]d by LMP, confirmed by U/S on 06/27/11 Vaginal bleeding in first trimester  PLAN D/C home Ok to continue teaching aerobics classes but if bleeding occurs again, consider not teaching, or not exercising during class F/U with early prenatal care Return to MAU as needed   LEFTW[place]-KIRBY, Talishia Betzler 06/27/2011 4:12 PM

## 2011-06-27 NOTE — MAU Provider Note (Signed)
Attestation of Attending Supervision of Advanced Practitioner: Evaluation and management procedures were performed by the [facility] Fellow/PA/CNM/NP under my supervision and collaboration. Chart reviewed, and agree with management and plan.  Jaynie Collins, M.D. 06/27/2011 5:10 PM

## 2011-06-27 NOTE — Discharge Instructions (Signed)
Vaginal Bleeding During Pregnancy, First Trimester  A small amount of bleeding (spotting) is relatively common in early pregnancy. It usually stops on its own. There are many causes for bleeding or spotting in early pregnancy. Some bleeding may be related to the pregnancy and some may not. Cramping with the bleeding is more serious and concerning. Tell your caregiver if you have any vaginal bleeding.   CAUSES    It is normal in most cases.   The pregnancy ends (miscarriage).   The pregnancy may end (threatened miscarriage).   Infection or inflammation of the cervix.   Growths (polyps) on the cervix.   Pregnancy happens outside of the uterus and in a fallopian tube (tubal pregnancy).   Many tiny cysts in the uterus instead of pregnancy tissue (molar pregnancy).  SYMPTOMS   Vaginal bleeding or spotting with or without cramps.  DIAGNOSIS   To evaluate the pregnancy, your caregiver may:   Do a pelvic exam.   Take blood tests.   Do an ultrasound.  It is very important to follow your caregiver's instructions.   TREATMENT    Evaluation of the pregnancy with blood tests and ultrasound.   Bed rest (getting up to use the bathroom only).   Rho-gam immunization if the mother is Rh negative and the father is Rh positive.  HOME CARE INSTRUCTIONS    If your caregiver orders bed rest, you may need to make arrangements for the care of other children and for other responsibilities. However, your caregiver may allow you to continue light activity.   Keep track of the number of pads you use each day, how often you change pads and how soaked (saturated) they are. Write this down.   Do not use tampons. Do not douche.   Do not have sexual intercourse or orgasms until approved by your physician.   Save any tissue that you pass for your caregiver to see.   Take medicine for cramps only with your caregiver's permission.   Do not take aspirin because it can make you bleed.  SEEK IMMEDIATE MEDICAL CARE IF:    You  experience severe cramps in your stomach, back or belly (abdomen).   You have an oral temperature above 102 F (38.9 C), not controlled by medicine.   You pass large clots or tissue.   Your bleeding increases or you become light-headed, weak or have fainting episodes.   You develop chills.   You are leaking or have a gush of fluid from your vagina.   You pass out while having a bowel movement. That may mean you have a ruptured tubal pregnancy.  Document Released: 11/03/2004 Document Revised: 01/13/2011 Document Reviewed: 05/15/2008  [employer] Patient Information 2012 [employer].

## 2011-06-27 NOTE — MAU Note (Signed)
Patient states she started spotting on 5-17 bright red with pain in back. Has become brown and mucus with back pain continues and abdominal cramping.

## 2011-06-27 NOTE — MAU Note (Signed)
Pt says works for [employer], was at work Friday and wasn't felling well and had cbc, cmet, and tsh and pt states results were normal. Friday pt states vaginal bleeding red on a panty liner., continued until sat morning 11am. Then became dark brown with mucus. Continued through Sunday into today as light brown. Has had light cramps before Friday . On Friday cramps became worse. Took no meds. Cramps are less now. Headache started on Friday did not take any meds. Headache less now. Has an appointment today with [facility]- pt called them and they told her to come to hospital. In room no bleeding noted. Pain level is a 6.

## 2011-07-18 ENCOUNTER — Encounter: Payer: Self-pay | Admitting: Physician Assistant

## 2011-07-18 ENCOUNTER — Ambulatory Visit (INDEPENDENT_AMBULATORY_CARE_PROVIDER_SITE_OTHER): Payer: BC Managed Care – PPO | Admitting: Physician Assistant

## 2011-07-18 VITALS — BP 107/67 | Wt 103.0 lb

## 2011-07-18 DIAGNOSIS — Z3480 Encounter for supervision of other normal pregnancy, unspecified trimester: Secondary | ICD-10-CM

## 2011-07-18 NOTE — Patient Instructions (Signed)
Pregnancy - First Trimester  During sexual intercourse, millions of sperm go into the vagina. Only 1 sperm will penetrate and fertilize the female egg while it is in the Fallopian tube. One week later, the fertilized egg implants into the wall of the uterus. An embryo begins to develop into a baby. At 6 to 8 weeks, the eyes and face are formed and the heartbeat can be seen on ultrasound. At the end of 12 weeks (first trimester), all the baby's organs are formed. Now that you are pregnant, you will want to do everything you can to have a healthy baby. Two of the most important things are to get good prenatal care and follow your caregiver's instructions. Prenatal care is all the medical care you receive before the baby's birth. It is given to prevent, find, and treat problems during the pregnancy and childbirth.  PRENATAL EXAMS   During prenatal visits, your weight, blood pressure and urine are checked. This is done to make sure you are healthy and progressing normally during the pregnancy.   A pregnant woman should gain 25 to 35 pounds during the pregnancy. However, if you are over weight or underweight, your caregiver will advise you regarding your weight.   Your caregiver will ask and answer questions for you.   Blood work, cervical cultures, other necessary tests and a Pap test are done during your prenatal exams. These tests are done to check on your health and the probable health of your baby. Tests are strongly recommended and done for HIV with your permission. This is the virus that causes AIDS. These tests are done because medications can be given to help prevent your baby from being born with this infection should you have been infected without knowing it. Blood work is also used to find out your blood type, previous infections and follow your blood levels (hemoglobin).   Low hemoglobin (anemia) is common during pregnancy. Iron and vitamins are given to help prevent this. Later in the pregnancy, blood  tests for diabetes will be done along with any other tests if any problems develop. You may need tests to make sure you and the baby are doing well.   You may need other tests to make sure you and the baby are doing well.  CHANGES DURING THE FIRST TRIMESTER (THE FIRST 3 MONTHS OF PREGNANCY)  Your body goes through many changes during pregnancy. They vary from person to person. Talk to your caregiver about changes you notice and are concerned about. Changes can include:   Your menstrual period stops.   The egg and sperm carry the genes that determine what you look like. Genes from you and your partner are forming a baby. The female genes determine whether the baby is a boy or a girl.   Your body increases in girth and you may feel bloated.   Feeling sick to your stomach (nauseous) and throwing up (vomiting). If the vomiting is uncontrollable, call your caregiver.   Your breasts will begin to enlarge and become tender.   Your nipples may stick out more and become darker.   The need to urinate more. Painful urination may mean you have a bladder infection.   Tiring easily.   Loss of appetite.   Cravings for certain kinds of food.   At first, you may gain or lose a couple of pounds.   You may have changes in your emotions from day to day (excited to be pregnant or concerned something may go wrong with   the pregnancy and baby).   You may have more vivid and strange dreams.  HOME CARE INSTRUCTIONS    It is very important to avoid all smoking, alcohol and un-prescribed drugs during your pregnancy. These affect the formation and growth of the baby. Avoid chemicals while pregnant to ensure the delivery of a healthy infant.   Start your prenatal visits by the 12th week of pregnancy. They are usually scheduled monthly at first, then more often in the last 2 months before delivery. Keep your caregiver's appointments. Follow your caregiver's instructions regarding medication use, blood and lab tests, exercise, and  diet.   During pregnancy, you are providing food for you and your baby. Eat regular, well-balanced meals. Choose foods such as meat, fish, milk and other low fat dairy products, vegetables, fruits, and whole-grain breads and cereals. Your caregiver will tell you of the ideal weight gain.   You can help morning sickness by keeping soda crackers at the bedside. Eat a couple before arising in the morning. You may want to use the crackers without salt on them.   Eating 4 to 5 small meals rather than 3 large meals a day also may help the nausea and vomiting.   Drinking liquids between meals instead of during meals also seems to help nausea and vomiting.   A physical sexual relationship may be continued throughout pregnancy if there are no other problems. Problems may be early (premature) leaking of amniotic fluid from the membranes, vaginal bleeding, or belly (abdominal) pain.   Exercise regularly if there are no restrictions. Check with your caregiver or physical therapist if you are unsure of the safety of some of your exercises. Greater weight gain will occur in the last 2 trimesters of pregnancy. Exercising will help:   Control your weight.   Keep you in shape.   Prepare you for labor and delivery.   Help you lose your pregnancy weight after you deliver your baby.   Wear a good support or jogging bra for breast tenderness during pregnancy. This may help if worn during sleep too.   Ask when prenatal classes are available. Begin classes when they are offered.   Do not use hot tubs, steam rooms or saunas.   Wear your seat belt when driving. This protects you and your baby if you are in an accident.   Avoid raw meat, uncooked cheese, cat litter boxes and soil used by cats throughout the pregnancy. These carry germs that can cause birth defects in the baby.   The first trimester is a good time to visit your dentist for your dental health. Getting your teeth cleaned is OK. Use a softer toothbrush and brush  gently during pregnancy.   Ask for help if you have financial, counseling or nutritional needs during pregnancy. Your caregiver will be able to offer counseling for these needs as well as refer you for other special needs.   Do not take any medications or herbs unless told by your caregiver.   Inform your caregiver if there is any mental or physical domestic violence.   Make a list of emergency phone numbers of family, friends, hospital, and police and fire departments.   Write down your questions. Take them to your prenatal visit.   Do not douche.   Do not cross your legs.   If you have to stand for long periods of time, rotate you feet or take small steps in a circle.   You may have more vaginal secretions that may   require a sanitary pad. Do not use tampons or scented sanitary pads.  MEDICATIONS AND DRUG USE IN PREGNANCY   Take prenatal vitamins as directed. The vitamin should contain 1 milligram of folic acid. Keep all vitamins out of reach of children. Only a couple vitamins or tablets containing iron may be fatal to a baby or young child when ingested.   Avoid use of all medications, including herbs, over-the-counter medications, not prescribed or suggested by your caregiver. Only take over-the-counter or prescription medicines for pain, discomfort, or fever as directed by your caregiver. Do not use aspirin, ibuprofen, or naproxen unless directed by your caregiver.   Let your caregiver also know about herbs you may be using.   Alcohol is related to a number of birth defects. This includes fetal alcohol syndrome. All alcohol, in any form, should be avoided completely. Smoking will cause low birth rate and premature babies.   Street or illegal drugs are very harmful to the baby. They are absolutely forbidden. A baby born to an addicted mother will be addicted at birth. The baby will go through the same withdrawal an adult does.   Let your caregiver know about any medications that you have to take  and for what reason you take them.  MISCARRIAGE IS COMMON DURING PREGNANCY  A miscarriage does not mean you did something wrong. It is not a reason to worry about getting pregnant again. Your caregiver will help you with questions you may have. If you have a miscarriage, you may need minor surgery.  SEEK MEDICAL CARE IF:   You have any concerns or worries during your pregnancy. It is better to call with your questions if you feel they cannot wait, rather than worry about them.  SEEK IMMEDIATE MEDICAL CARE IF:    An unexplained oral temperature above 102 F (38.9 C) develops, or as your caregiver suggests.   You have leaking of fluid from the vagina (birth canal). If leaking membranes are suspected, take your temperature and inform your caregiver of this when you call.   There is vaginal spotting or bleeding. Notify your caregiver of the amount and how many pads are used.   You develop a bad smelling vaginal discharge with a change in the color.   You continue to feel sick to your stomach (nauseated) and have no relief from remedies suggested. You vomit blood or coffee ground-like materials.   You lose more than 2 pounds of weight in 1 week.   You gain more than 2 pounds of weight in 1 week and you notice swelling of your face, hands, feet, or legs.   You gain 5 pounds or more in 1 week (even if you do not have swelling of your hands, face, legs, or feet).   You get exposed to German measles and have never had them.   You are exposed to fifth disease or chickenpox.   You develop belly (abdominal) pain. Round ligament discomfort is a common non-cancerous (benign) cause of abdominal pain in pregnancy. Your caregiver still must evaluate this.   You develop headache, fever, diarrhea, pain with urination, or shortness of breath.   You fall or are in a car accident or have any kind of trauma.   There is mental or physical violence in your home.  Document Released: 01/18/2001 Document Revised: 01/13/2011  Document Reviewed: 07/22/2008  [employer] Patient Information 2012 [employer].

## 2011-07-18 NOTE — Progress Notes (Signed)
New OB.  Seen in [facility]U for small amount of vaginal bleeding.

## 2011-07-18 NOTE — Progress Notes (Signed)
   Subjective:    Haley [name] is a G3P1011 [redacted]w[redacted]d being seen today for her first obstetrical visit.  Her obstetrical history is significant for short pregnancy interval, son is 63 months old. Patient does intend to breast feed. Pregnancy history fully reviewed.  Patient reports no complaints.  Filed Vitals:   07/18/11 0909  BP: 107/67  Weight: 103 lb (46.72 kg)    HISTORY: OB History    Grav Para Term Preterm Abortions TAB SAB Ect Mult Living   3 1 1  0 1 1 0 0 0 1     # Outc Date GA Lbr Len/2nd Wgt Sex Del Anes PTL Lv   1 TRM 7/12 [redacted]w[redacted]d 07:25 / 00:43 6lb5.4oz(2.875kg) M SVD EPI  Yes   2 TAB            3 CUR              Past Medical History  Diagnosis Date  . No pertinent past medical history    Past Surgical History  Procedure Date  . Wisdom tooth extraction   . Wisdom tooth extraction    Family History  Problem Relation Age of Onset  . Asthma Mother   . Diabetes Father      Exam    Uterus:     Pelvic Exam:    Perineum: No Hemorrhoids, Normal Perineum   Vulva: normal   Vagina:  normal mucosa, normal discharge   Cervix: no cervical motion tenderness   Adnexa: normal adnexa   Bony Pelvis: gynecoid  System: Breast:  normal appearance, no masses or tenderness   Skin: normal coloration and turgor, no rashes    Neurologic: oriented, normal, negative, normal mood   Extremities: normal strength, tone, and muscle mass   HEENT Grossly NL   Mouth/Teeth mucous membranes moist, pharynx normal without lesions   Neck supple and no masses   Cardiovascular: regular rate and rhythm, no murmurs or gallops   Respiratory:  appears well, vitals normal, no respiratory distress, acyanotic, normal RR, ear and throat exam is normal, neck free of mass or lymphadenopathy, chest clear, no wheezing, crepitations, rhonchi, normal symmetric air entry   Abdomen: soft, non-tender; bowel sounds normal; no masses,  no organomegaly   Urinary: urethral meatus normal   [place]: IUP with cardiac  activity today   Assessment:    Pregnancy: G3P1011 [redacted]w[redacted]d       Plan:     Initial labs drawn. Prenatal vitamins. Problem list reviewed and updated. Genetic Screening discussed Integrated Screen: ordered.  Ultrasound discussed; fetal survey: discussed, will order at 18-[redacted] weeks gestation.  Follow up in 5 weeks.   Lynnette Pote E. 07/18/2011

## 2011-07-19 LAB — HIV ANTIBODY (ROUTINE TESTING W REFLEX): HIV: NONREACTIVE

## 2011-07-20 LAB — CULTURE, OB URINE
Colony Count: NO GROWTH
Organism ID, Bacteria: NO GROWTH

## 2011-08-13 LAB — OB RESULTS CONSOLE GC/CHLAMYDIA
Chlamydia: NEGATIVE
Gonorrhea: NEGATIVE

## 2011-09-02 ENCOUNTER — Ambulatory Visit (INDEPENDENT_AMBULATORY_CARE_PROVIDER_SITE_OTHER): Payer: PRIVATE HEALTH INSURANCE | Admitting: Physician Assistant

## 2011-09-02 VITALS — BP 107/69 | Temp 98.6°F | Wt 104.0 lb

## 2011-09-02 DIAGNOSIS — Z3480 Encounter for supervision of other normal pregnancy, unspecified trimester: Secondary | ICD-10-CM | POA: Insufficient documentation

## 2011-09-02 NOTE — Patient Instructions (Signed)
Pregnancy - Second Trimester The second trimester of pregnancy (3 to 6 months) is a period of rapid growth for you and your baby. At the end of the sixth month, your baby is about 9 inches long and weighs 1 1/2 pounds. You will begin to feel the baby move between 18 and 20 weeks of the pregnancy. This is called quickening. Weight gain is faster. A clear fluid (colostrum) may leak out of your breasts. You may feel small contractions of the womb (uterus). This is known as false labor or Braxton-Hicks contractions. This is like a practice for labor when the baby is ready to be born. Usually, the problems with morning sickness have usually passed by the end of your first trimester. Some women develop small dark blotches (called cholasma, mask of pregnancy) on their face that usually goes away after the baby is born. Exposure to the sun makes the blotches worse. Acne may also develop in some pregnant women and pregnant women who have acne, may find that it goes away. PRENATAL EXAMS  Blood work may continue to be done during prenatal exams. These tests are done to check on your health and the probable health of your baby. Blood work is used to follow your blood levels (hemoglobin). Anemia (low hemoglobin) is common during pregnancy. Iron and vitamins are given to help prevent this. You will also be checked for diabetes between 24 and 28 weeks of the pregnancy. Some of the previous blood tests may be repeated.   The size of the uterus is measured during each visit. This is to make sure that the baby is continuing to grow properly according to the dates of the pregnancy.   Your blood pressure is checked every prenatal visit. This is to make sure you are not getting toxemia.   Your urine is checked to make sure you do not have an infection, diabetes or protein in the urine.   Your weight is checked often to make sure gains are happening at the suggested rate. This is to ensure that both you and your baby are  growing normally.   Sometimes, an ultrasound is performed to confirm the proper growth and development of the baby. This is a test which bounces harmless sound waves off the baby so your caregiver can more accurately determine due dates.  Sometimes, a specialized test is done on the amniotic fluid surrounding the baby. This test is called an amniocentesis. The amniotic fluid is obtained by sticking a needle into the belly (abdomen). This is done to check the chromosomes in instances where there is a concern about possible genetic problems with the baby. It is also sometimes done near the end of pregnancy if an early delivery is required. In this case, it is done to help make sure the baby's lungs are mature enough for the baby to live outside of the womb. CHANGES OCCURING IN THE SECOND TRIMESTER OF PREGNANCY Your body goes through many changes during pregnancy. They vary from person to person. Talk to your caregiver about changes you notice that you are concerned about.  During the second trimester, you will likely have an increase in your appetite. It is normal to have cravings for certain foods. This varies from person to person and pregnancy to pregnancy.   Your lower abdomen will begin to bulge.   You may have to urinate more often because the uterus and baby are pressing on your bladder. It is also common to get more bladder infections during pregnancy (  pain with urination). You can help this by drinking lots of fluids and emptying your bladder before and after intercourse.   You may begin to get stretch marks on your hips, abdomen, and breasts. These are normal changes in the body during pregnancy. There are no exercises or medications to take that prevent this change.   You may begin to develop swollen and bulging veins (varicose veins) in your legs. Wearing support hose, elevating your feet for 15 minutes, 3 to 4 times a day and limiting salt in your diet helps lessen the problem.    Heartburn may develop as the uterus grows and pushes up against the stomach. Antacids recommended by your caregiver helps with this problem. Also, eating smaller meals 4 to 5 times a day helps.   Constipation can be treated with a stool softener or adding bulk to your diet. Drinking lots of fluids, vegetables, fruits, and whole grains are helpful.   Exercising is also helpful. If you have been very active up until your pregnancy, most of these activities can be continued during your pregnancy. If you have been less active, it is helpful to start an exercise program such as walking.   Hemorrhoids (varicose veins in the rectum) may develop at the end of the second trimester. Warm sitz baths and hemorrhoid cream recommended by your caregiver helps hemorrhoid problems.   Backaches may develop during this time of your pregnancy. Avoid heavy lifting, wear low heal shoes and practice good posture to help with backache problems.   Some pregnant women develop tingling and numbness of their hand and fingers because of swelling and tightening of ligaments in the wrist (carpel tunnel syndrome). This goes away after the baby is born.   As your breasts enlarge, you may have to get a bigger bra. Get a comfortable, cotton, support bra. Do not get a nursing bra until the last month of the pregnancy if you will be nursing the baby.   You may get a dark line from your belly button to the pubic area called the linea nigra.   You may develop rosy cheeks because of increase blood flow to the face.   You may develop spider looking lines of the face, neck, arms and chest. These go away after the baby is born.  HOME CARE INSTRUCTIONS   It is extremely important to avoid all smoking, herbs, alcohol, and unprescribed drugs during your pregnancy. These chemicals affect the formation and growth of the baby. Avoid these chemicals throughout the pregnancy to ensure the delivery of a healthy infant.   Most of your home  care instructions are the same as suggested for the first trimester of your pregnancy. Keep your caregiver's appointments. Follow your caregiver's instructions regarding medication use, exercise and diet.   During pregnancy, you are providing food for you and your baby. Continue to eat regular, well-balanced meals. Choose foods such as meat, fish, milk and other low fat dairy products, vegetables, fruits, and whole-grain breads and cereals. Your caregiver will tell you of the ideal weight gain.   A physical sexual relationship may be continued up until near the end of pregnancy if there are no other problems. Problems could include early (premature) leaking of amniotic fluid from the membranes, vaginal bleeding, abdominal pain, or other medical or pregnancy problems.   Exercise regularly if there are no restrictions. Check with your caregiver if you are unsure of the safety of some of your exercises. The greatest weight gain will occur in the   last 2 trimesters of pregnancy. Exercise will help you:   Control your weight.   Get you in shape for labor and delivery.   Lose weight after you have the baby.   Wear a good support or jogging bra for breast tenderness during pregnancy. This may help if worn during sleep. Pads or tissues may be used in the bra if you are leaking colostrum.   Do not use hot tubs, steam rooms or saunas throughout the pregnancy.   Wear your seat belt at all times when driving. This protects you and your baby if you are in an accident.   Avoid raw meat, uncooked cheese, cat litter boxes and soil used by cats. These carry germs that can cause birth defects in the baby.   The second trimester is also a good time to visit your dentist for your dental health if this has not been done yet. Getting your teeth cleaned is OK. Use a soft toothbrush. Brush gently during pregnancy.   It is easier to loose urine during pregnancy. Tightening up and strengthening the pelvic muscles will  help with this problem. Practice stopping your urination while you are going to the bathroom. These are the same muscles you need to strengthen. It is also the muscles you would use as if you were trying to stop from passing gas. You can practice tightening these muscles up 10 times a set and repeating this about 3 times per day. Once you know what muscles to tighten up, do not perform these exercises during urination. It is more likely to contribute to an infection by backing up the urine.   Ask for help if you have financial, counseling or nutritional needs during pregnancy. Your caregiver will be able to offer counseling for these needs as well as refer you for other special needs.   Your skin may become oily. If so, wash your face with mild soap, use non-greasy moisturizer and oil or cream based makeup.  MEDICATIONS AND DRUG USE IN PREGNANCY  Take prenatal vitamins as directed. The vitamin should contain 1 milligram of folic acid. Keep all vitamins out of reach of children. Only a couple vitamins or tablets containing iron may be fatal to a baby or young child when ingested.   Avoid use of all medications, including herbs, over-the-counter medications, not prescribed or suggested by your caregiver. Only take over-the-counter or prescription medicines for pain, discomfort, or fever as directed by your caregiver. Do not use aspirin.   Let your caregiver also know about herbs you may be using.   Alcohol is related to a number of birth defects. This includes fetal alcohol syndrome. All alcohol, in any form, should be avoided completely. Smoking will cause low birth rate and premature babies.   Street or illegal drugs are very harmful to the baby. They are absolutely forbidden. A baby born to an addicted mother will be addicted at birth. The baby will go through the same withdrawal an adult does.  SEEK MEDICAL CARE IF:  You have any concerns or worries during your pregnancy. It is better to call with  your questions if you feel they cannot wait, rather than worry about them. SEEK IMMEDIATE MEDICAL CARE IF:   An unexplained oral temperature above 102 F (38.9 C) develops, or as your caregiver suggests.   You have leaking of fluid from the vagina (birth canal). If leaking membranes are suspected, take your temperature and tell your caregiver of this when you call.   There   is vaginal spotting, bleeding, or passing clots. Tell your caregiver of the amount and how many pads are used. Light spotting in pregnancy is common, especially following intercourse.   You develop a bad smelling vaginal discharge with a change in the color from clear to white.   You continue to feel sick to your stomach (nauseated) and have no relief from remedies suggested. You vomit blood or coffee ground-like materials.   You lose more than 2 pounds of weight or gain more than 2 pounds of weight over 1 week, or as suggested by your caregiver.   You notice swelling of your face, hands, feet, or legs.   You get exposed to German measles and have never had them.   You are exposed to fifth disease or chickenpox.   You develop belly (abdominal) pain. Round ligament discomfort is a common non-cancerous (benign) cause of abdominal pain in pregnancy. Your caregiver still must evaluate you.   You develop a bad headache that does not go away.   You develop fever, diarrhea, pain with urination, or shortness of breath.   You develop visual problems, blurry, or double vision.   You fall or are in a car accident or any kind of trauma.   There is mental or physical violence at home.  Document Released: 01/18/2001 Document Revised: 01/13/2011 Document Reviewed: 07/23/2008 [employer] Patient Information 2012 [employer]. 

## 2011-09-02 NOTE — Progress Notes (Signed)
p=[id number] 

## 2011-09-02 NOTE — Progress Notes (Signed)
Slow weight gain. Reviewed weight gain recommendations. 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 [place] scheduled.

## 2011-09-30 ENCOUNTER — Ambulatory Visit (HOSPITAL_COMMUNITY)
Admission: RE | Admit: 2011-09-30 | Discharge: 2011-09-30 | Disposition: A | Discharge status: Home / Self Care | Payer: No Typology Code available for payment source | Source: Ambulatory Visit | Attending: Physician Assistant | Admitting: Physician Assistant

## 2011-09-30 ENCOUNTER — Ambulatory Visit (HOSPITAL_COMMUNITY): Payer: PRIVATE HEALTH INSURANCE

## 2011-09-30 DIAGNOSIS — Z3480 Encounter for supervision of other normal pregnancy, unspecified trimester: Secondary | ICD-10-CM

## 2011-09-30 DIAGNOSIS — Z363 Encounter for antenatal screening for malformations: Secondary | ICD-10-CM | POA: Insufficient documentation

## 2011-09-30 DIAGNOSIS — O358XX0 Maternal care for other (suspected) fetal abnormality and damage, not applicable or unspecified: Secondary | ICD-10-CM | POA: Insufficient documentation

## 2011-09-30 DIAGNOSIS — Z1389 Encounter for screening for other disorder: Secondary | ICD-10-CM | POA: Insufficient documentation

## 2011-10-02 ENCOUNTER — Encounter: Payer: Self-pay | Admitting: Physician Assistant

## 2011-10-03 ENCOUNTER — Ambulatory Visit (INDEPENDENT_AMBULATORY_CARE_PROVIDER_SITE_OTHER): Payer: PRIVATE HEALTH INSURANCE | Admitting: Advanced Practice Midwife

## 2011-10-03 VITALS — BP 99/55 | Wt 110.0 lb

## 2011-10-03 DIAGNOSIS — O09899 Supervision of other high risk pregnancies, unspecified trimester: Secondary | ICD-10-CM

## 2011-10-03 NOTE — Progress Notes (Signed)
Better weight gain. 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 MCD not finished yet. [place] reviewed >> Normal anatomy, but placenta is Anterior with Posterior Succenturate Lobe and has marginal cord insertion >> will ask [place] about possibility of vasa previa as to where vessels traverse.  Awaiting call back from Radiologist

## 2011-10-03 NOTE — Patient Instructions (Addendum)
Pregnancy - Second Trimester The second trimester of pregnancy (3 to 6 months) is a period of rapid growth for you and your baby. At the end of the sixth month, your baby is about 9 inches long and weighs 1 1/2 pounds. You will begin to feel the baby move between 18 and 20 weeks of the pregnancy. This is called quickening. Weight gain is faster. A clear fluid (colostrum) may leak out of your breasts. You may feel small contractions of the womb (uterus). This is known as false labor or Braxton-Hicks contractions. This is like a practice for labor when the baby is ready to be born. Usually, the problems with morning sickness have usually passed by the end of your first trimester. Some women develop small dark blotches (called cholasma, mask of pregnancy) on their face that usually goes away after the baby is born. Exposure to the sun makes the blotches worse. Acne may also develop in some pregnant women and pregnant women who have acne, may find that it goes away. PRENATAL EXAMS  Blood work may continue to be done during prenatal exams. These tests are done to check on your health and the probable health of your baby. Blood work is used to follow your blood levels (hemoglobin). Anemia (low hemoglobin) is common during pregnancy. Iron and vitamins are given to help prevent this. You will also be checked for diabetes between 24 and 28 weeks of the pregnancy. Some of the previous blood tests may be repeated.   The size of the uterus is measured during each visit. This is to make sure that the baby is continuing to grow properly according to the dates of the pregnancy.   Your blood pressure is checked every prenatal visit. This is to make sure you are not getting toxemia.   Your urine is checked to make sure you do not have an infection, diabetes or protein in the urine.   Your weight is checked often to make sure gains are happening at the suggested rate. This is to ensure that both you and your baby are  growing normally.   Sometimes, an ultrasound is performed to confirm the proper growth and development of the baby. This is a test which bounces harmless sound waves off the baby so your caregiver can more accurately determine due dates.  Sometimes, a specialized test is done on the amniotic fluid surrounding the baby. This test is called an amniocentesis. The amniotic fluid is obtained by sticking a needle into the belly (abdomen). This is done to check the chromosomes in instances where there is a concern about possible genetic problems with the baby. It is also sometimes done near the end of pregnancy if an early delivery is required. In this case, it is done to help make sure the baby's lungs are mature enough for the baby to live outside of the womb. CHANGES OCCURING IN THE SECOND TRIMESTER OF PREGNANCY Your body goes through many changes during pregnancy. They vary from person to person. Talk to your caregiver about changes you notice that you are concerned about.  During the second trimester, you will likely have an increase in your appetite. It is normal to have cravings for certain foods. This varies from person to person and pregnancy to pregnancy.   Your lower abdomen will begin to bulge.   You may have to urinate more often because the uterus and baby are pressing on your bladder. It is also common to get more bladder infections during pregnancy (  pain with urination). You can help this by drinking lots of fluids and emptying your bladder before and after intercourse.   You may begin to get stretch marks on your hips, abdomen, and breasts. These are normal changes in the body during pregnancy. There are no exercises or medications to take that prevent this change.   You may begin to develop swollen and bulging veins (varicose veins) in your legs. Wearing support hose, elevating your feet for 15 minutes, 3 to 4 times a day and limiting salt in your diet helps lessen the problem.    Heartburn may develop as the uterus grows and pushes up against the stomach. Antacids recommended by your caregiver helps with this problem. Also, eating smaller meals 4 to 5 times a day helps.   Constipation can be treated with a stool softener or adding bulk to your diet. Drinking lots of fluids, vegetables, fruits, and whole grains are helpful.   Exercising is also helpful. If you have been very active up until your pregnancy, most of these activities can be continued during your pregnancy. If you have been less active, it is helpful to start an exercise program such as walking.   Hemorrhoids (varicose veins in the rectum) may develop at the end of the second trimester. Warm sitz baths and hemorrhoid cream recommended by your caregiver helps hemorrhoid problems.   Backaches may develop during this time of your pregnancy. Avoid heavy lifting, wear low heal shoes and practice good posture to help with backache problems.   Some pregnant women develop tingling and numbness of their hand and fingers because of swelling and tightening of ligaments in the wrist (carpel tunnel syndrome). This goes away after the baby is born.   As your breasts enlarge, you may have to get a bigger bra. Get a comfortable, cotton, support bra. Do not get a nursing bra until the last month of the pregnancy if you will be nursing the baby.   You may get a dark line from your belly button to the pubic area called the linea nigra.   You may develop rosy cheeks because of increase blood flow to the face.   You may develop spider looking lines of the face, neck, arms and chest. These go away after the baby is born.  HOME CARE INSTRUCTIONS   It is extremely important to avoid all smoking, herbs, alcohol, and unprescribed drugs during your pregnancy. These chemicals affect the formation and growth of the baby. Avoid these chemicals throughout the pregnancy to ensure the delivery of a healthy infant.   Most of your home  care instructions are the same as suggested for the first trimester of your pregnancy. Keep your caregiver's appointments. Follow your caregiver's instructions regarding medication use, exercise and diet.   During pregnancy, you are providing food for you and your baby. Continue to eat regular, well-balanced meals. Choose foods such as meat, fish, milk and other low fat dairy products, vegetables, fruits, and whole-grain breads and cereals. Your caregiver will tell you of the ideal weight gain.   A physical sexual relationship may be continued up until near the end of pregnancy if there are no other problems. Problems could include early (premature) leaking of amniotic fluid from the membranes, vaginal bleeding, abdominal pain, or other medical or pregnancy problems.   Exercise regularly if there are no restrictions. Check with your caregiver if you are unsure of the safety of some of your exercises. The greatest weight gain will occur in the   last 2 trimesters of pregnancy. Exercise will help you:   Control your weight.   Get you in shape for labor and delivery.   Lose weight after you have the baby.   Wear a good support or jogging bra for breast tenderness during pregnancy. This may help if worn during sleep. Pads or tissues may be used in the bra if you are leaking colostrum.   Do not use hot tubs, steam rooms or saunas throughout the pregnancy.   Wear your seat belt at all times when driving. This protects you and your baby if you are in an accident.   Avoid raw meat, uncooked cheese, cat litter boxes and soil used by cats. These carry germs that can cause birth defects in the baby.   The second trimester is also a good time to visit your dentist for your dental health if this has not been done yet. Getting your teeth cleaned is OK. Use a soft toothbrush. Brush gently during pregnancy.   It is easier to loose urine during pregnancy. Tightening up and strengthening the pelvic muscles will  help with this problem. Practice stopping your urination while you are going to the bathroom. These are the same muscles you need to strengthen. It is also the muscles you would use as if you were trying to stop from passing gas. You can practice tightening these muscles up 10 times a set and repeating this about 3 times per day. Once you know what muscles to tighten up, do not perform these exercises during urination. It is more likely to contribute to an infection by backing up the urine.   Ask for help if you have financial, counseling or nutritional needs during pregnancy. Your caregiver will be able to offer counseling for these needs as well as refer you for other special needs.   Your skin may become oily. If so, wash your face with mild soap, use non-greasy moisturizer and oil or cream based makeup.  MEDICATIONS AND DRUG USE IN PREGNANCY  Take prenatal vitamins as directed. The vitamin should contain 1 milligram of folic acid. Keep all vitamins out of reach of children. Only a couple vitamins or tablets containing iron may be fatal to a baby or young child when ingested.   Avoid use of all medications, including herbs, over-the-counter medications, not prescribed or suggested by your caregiver. Only take over-the-counter or prescription medicines for pain, discomfort, or fever as directed by your caregiver. Do not use aspirin.   Let your caregiver also know about herbs you may be using.   Alcohol is related to a number of birth defects. This includes fetal alcohol syndrome. All alcohol, in any form, should be avoided completely. Smoking will cause low birth rate and premature babies.   Street or illegal drugs are very harmful to the baby. They are absolutely forbidden. A baby born to an addicted mother will be addicted at birth. The baby will go through the same withdrawal an adult does.  SEEK MEDICAL CARE IF:  You have any concerns or worries during your pregnancy. It is better to call with  your questions if you feel they cannot wait, rather than worry about them. SEEK IMMEDIATE MEDICAL CARE IF:   An unexplained oral temperature above 102 F (38.9 C) develops, or as your caregiver suggests.   You have leaking of fluid from the vagina (birth canal). If leaking membranes are suspected, take your temperature and tell your caregiver of this when you call.   There   is vaginal spotting, bleeding, or passing clots. Tell your caregiver of the amount and how many pads are used. Light spotting in pregnancy is common, especially following intercourse.   You develop a bad smelling vaginal discharge with a change in the color from clear to white.   You continue to feel sick to your stomach (nauseated) and have no relief from remedies suggested. You vomit blood or coffee ground-like materials.   You lose more than 2 pounds of weight or gain more than 2 pounds of weight over 1 week, or as suggested by your caregiver.   You notice swelling of your face, hands, feet, or legs.   You get exposed to German measles and have never had them.   You are exposed to fifth disease or chickenpox.   You develop belly (abdominal) pain. Round ligament discomfort is a common non-cancerous (benign) cause of abdominal pain in pregnancy. Your caregiver still must evaluate you.   You develop a bad headache that does not go away.   You develop fever, diarrhea, pain with urination, or shortness of breath.   You develop visual problems, blurry, or double vision.   You fall or are in a car accident or any kind of trauma.   There is mental or physical violence at home.  Document Released: 01/18/2001 Document Revised: 01/13/2011 Document Reviewed: 07/23/2008 [employer] Patient Information 2012 [employer]. 

## 2011-10-26 ENCOUNTER — Ambulatory Visit (INDEPENDENT_AMBULATORY_CARE_PROVIDER_SITE_OTHER): Payer: BC Managed Care – PPO | Admitting: Obstetrics & Gynecology

## 2011-10-26 ENCOUNTER — Encounter: Payer: Self-pay | Admitting: Obstetrics & Gynecology

## 2011-10-26 ENCOUNTER — Encounter: Payer: Self-pay | Admitting: *Deleted

## 2011-10-26 VITALS — BP 106/62 | Temp 97.5°F | Wt 113.0 lb

## 2011-10-26 DIAGNOSIS — Z3480 Encounter for supervision of other normal pregnancy, unspecified trimester: Secondary | ICD-10-CM

## 2011-10-26 DIAGNOSIS — Z23 Encounter for immunization: Secondary | ICD-10-CM

## 2011-10-26 DIAGNOSIS — O09899 Supervision of other high risk pregnancies, unspecified trimester: Secondary | ICD-10-CM

## 2011-10-26 NOTE — Addendum Note (Signed)
Addended by: Nicholaus Bloom C on: 10/26/2011 11:07 AM   Modules accepted: Orders

## 2011-10-26 NOTE — Progress Notes (Signed)
This is a work in visit for back and abdominal pain for the last week. She feels like her "hormones are making me all over the place", fairly anxious. She denies a h/o depression/anxiety. I have recommended prenatal yoga/meditation. I have shown her some exercises for her back pain. We have discussed PTL precautions. She will get her 1 hour glucola in a month.

## 2011-10-26 NOTE — Progress Notes (Signed)
p- 75  C/o lower back and lower pelvic pain since last night

## 2011-10-26 NOTE — Progress Notes (Signed)
Flu vaccine today 

## 2011-11-30 ENCOUNTER — Ambulatory Visit (INDEPENDENT_AMBULATORY_CARE_PROVIDER_SITE_OTHER): Payer: PRIVATE HEALTH INSURANCE | Admitting: Obstetrics & Gynecology

## 2011-11-30 VITALS — BP 106/67 | Temp 98.0°F | Wt 118.0 lb

## 2011-11-30 DIAGNOSIS — Z3480 Encounter for supervision of other normal pregnancy, unspecified trimester: Secondary | ICD-10-CM

## 2011-11-30 DIAGNOSIS — Z23 Encounter for immunization: Secondary | ICD-10-CM

## 2011-11-30 NOTE — Progress Notes (Signed)
p-84  28 week labs today.

## 2011-11-30 NOTE — Progress Notes (Signed)
Routine visit. No problems. Good Fm. TDAP, glucola and labs today. I have recommended a chiropractor for her continued left sciatic pain.

## 2011-12-01 LAB — CBC
Hemoglobin: 10.6 g/dL — ABNORMAL LOW (ref 12.0–15.0)
MCH: 31.8 pg (ref 26.0–34.0)
MCHC: 34.6 g/dL (ref 30.0–36.0)
MCV: 91.9 fL (ref 78.0–100.0)

## 2011-12-01 LAB — RPR

## 2011-12-22 ENCOUNTER — Ambulatory Visit (INDEPENDENT_AMBULATORY_CARE_PROVIDER_SITE_OTHER): Payer: PRIVATE HEALTH INSURANCE | Admitting: Obstetrics & Gynecology

## 2011-12-22 ENCOUNTER — Encounter: Payer: Self-pay | Admitting: Obstetrics & Gynecology

## 2011-12-22 VITALS — BP 127/70 | Temp 98.4°F | Wt 124.0 lb

## 2011-12-22 DIAGNOSIS — O139 Gestational [pregnancy-induced] hypertension without significant proteinuria, unspecified trimester: Secondary | ICD-10-CM

## 2011-12-22 DIAGNOSIS — O09899 Supervision of other high risk pregnancies, unspecified trimester: Secondary | ICD-10-CM

## 2011-12-22 DIAGNOSIS — Z3480 Encounter for supervision of other normal pregnancy, unspecified trimester: Secondary | ICD-10-CM

## 2011-12-22 LAB — CBC
MCH: 32.6 pg (ref 26.0–34.0)
MCHC: 35.6 g/dL (ref 30.0–36.0)
Platelets: 203 10*3/uL (ref 150–400)
RDW: 13.3 % (ref 11.5–15.5)

## 2011-12-22 NOTE — Progress Notes (Signed)
Routine visit. No OB problems. Discussed her normal glucola, mild anemia. Rec iron daily. She reports good FM. Complains of some visual changes "spots" for about 3 days. Denies HA. I will order PIH panel.

## 2011-12-22 NOTE — Progress Notes (Signed)
p78 

## 2011-12-23 LAB — COMPREHENSIVE METABOLIC PANEL
AST: 17 U/L (ref 0–37)
Albumin: 3.6 g/dL (ref 3.5–5.2)
Alkaline Phosphatase: 94 U/L (ref 39–117)
Potassium: 4.1 mEq/L (ref 3.5–5.3)
Sodium: 136 mEq/L (ref 135–145)
Total Bilirubin: 0.4 mg/dL (ref 0.3–1.2)
Total Protein: 6 g/dL (ref 6.0–8.3)

## 2011-12-27 LAB — CREATININE CLEARANCE, URINE, 24 HOUR
Creatinine Clearance: 175 mL/min — ABNORMAL HIGH (ref 75–115)
Creatinine, 24H Ur: 1437 mg/d (ref 700–1800)
Creatinine, Urine: 130.6 mg/dL
Creatinine: 0.57 mg/dL (ref 0.50–1.10)

## 2011-12-27 LAB — PROTEIN, URINE, 24 HOUR
Protein, 24H Urine: 66 mg/d (ref 50–100)
Protein, Urine: 6 mg/dL

## 2011-12-28 ENCOUNTER — Encounter: Payer: Self-pay | Admitting: Obstetrics & Gynecology

## 2012-01-04 ENCOUNTER — Encounter (HOSPITAL_COMMUNITY): Payer: Self-pay

## 2012-01-04 ENCOUNTER — Inpatient Hospital Stay (HOSPITAL_COMMUNITY)
Admission: AD | Admit: 2012-01-04 | Discharge: 2012-01-04 | Disposition: A | Discharge status: Home / Self Care | Payer: Medicaid Other | Source: Ambulatory Visit | Attending: Family Medicine | Admitting: Family Medicine

## 2012-01-04 DIAGNOSIS — Z3480 Encounter for supervision of other normal pregnancy, unspecified trimester: Secondary | ICD-10-CM

## 2012-01-04 DIAGNOSIS — O4700 False labor before 37 completed weeks of gestation, unspecified trimester: Secondary | ICD-10-CM

## 2012-01-04 DIAGNOSIS — O09899 Supervision of other high risk pregnancies, unspecified trimester: Secondary | ICD-10-CM

## 2012-01-04 LAB — URINALYSIS, ROUTINE W REFLEX MICROSCOPIC
Bilirubin Urine: NEGATIVE
Glucose, UA: NEGATIVE mg/dL
Hgb urine dipstick: NEGATIVE
Protein, ur: NEGATIVE mg/dL
Urobilinogen, UA: 0.2 mg/dL (ref 0.0–1.0)

## 2012-01-04 NOTE — MAU Note (Signed)
UC's and back pain since 0600. Had intercourse this AM. States she had preterm contractions with previous pregnancy, had Mag Sulfate and steroids, then delivered at 38 wks.

## 2012-01-04 NOTE — MAU Provider Note (Signed)
  History     CSN: [id number]  Arrival date and time: 01/04/12 0830   First Provider Initiated Contact with Patient 01/04/12 [phone]      Chief Complaint  Patient presents with  . Contractions  . Back Pain   Pt presents with contractions. Pt reports contractions since this morning. She had sex last night. No other symptoms associated.  Good fetal movement.   Back Pain This is a recurrent problem. The current episode started in the past 7 days. The problem occurs intermittently. The problem is unchanged. The pain is present in the lumbar spine. The quality of the pain is described as aching. The pain does not radiate. The pain is at a severity of 3/10. The pain is mild. Pertinent negatives include no chest pain, fever or headaches.   Past Medical History  Diagnosis Date  . No pertinent past medical history     Past Surgical History  Procedure Date  . Wisdom tooth extraction   . Wisdom tooth extraction     Family History  Problem Relation Age of Onset  . Asthma Mother   . Diabetes Father     History  Substance Use Topics  . Smoking status: Never Smoker   . Smokeless tobacco: Never Used  . Alcohol Use: No    Allergies:  Allergies  Allergen Reactions  . Latex     REACTION: hives  . Penicillins     REACTION: hives    Prescriptions prior to admission  Medication Sig Dispense Refill  . acetaminophen (TYLENOL) 325 MG tablet Take 650 mg by mouth daily as needed.      . ferrous sulfate 325 (65 FE) MG tablet Take 325 mg by mouth daily with breakfast.      . Prenatal Vit-Fe Fumarate-FA (PRENATAL MULTIVITAMIN) TABS Take 1 tablet by mouth daily.        Review of Systems  Constitutional: Negative for fever and chills.  Eyes: Negative for blurred vision.  Respiratory: Negative for cough.   Cardiovascular: Negative for chest pain and palpitations.  Gastrointestinal: Negative for nausea, vomiting, diarrhea and constipation.  Musculoskeletal: Positive for back pain.    Neurological: Negative for headaches.   Physical Exam   Blood pressure 120/69, pulse 78, temperature 98.5 F (36.9 C), temperature source Oral, resp. rate 18, height 5' 0.5" (1.537 m), weight 55.792 kg (123 lb), last menstrual period 05/15/2011, unknown if currently breastfeeding.  Physical Exam  Constitutional: She appears well-developed.  HENT:  Head: Normocephalic.  Genitourinary: Vagina normal. Cervix exhibits no motion tenderness and no friability.  Cervix: closed  MAU Course  Procedures  MDM Discharge home  Assessment and Plan  Pt is 26yo with 33wk3d presented with contractions since this morning. Cervix checked and closed, so patient is not in labor. And she is stable to go home. FHT: 130s Contractions, irregular, Q5-43min, decreased in frequency while in MAU Category I Drink plenty of water Reviewed signs of PTL/reasons to return to MAU  Ginger Organ E 01/04/2012, 10:07 AM   I have seen this patient and agree with the above resident's note.  LEFTWICH-KIRBY, LISA Certified Nurse-Midwife

## 2012-01-04 NOTE — Progress Notes (Signed)
Misty Stanley, cnm notified of patient, her c/o contractions.

## 2012-01-06 NOTE — MAU Provider Note (Signed)
Chart reviewed and agree with management and plan.  

## 2012-01-12 ENCOUNTER — Ambulatory Visit (INDEPENDENT_AMBULATORY_CARE_PROVIDER_SITE_OTHER): Payer: PRIVATE HEALTH INSURANCE | Admitting: Obstetrics & Gynecology

## 2012-01-12 VITALS — BP 116/74 | Temp 98.4°F | Wt 127.0 lb

## 2012-01-12 DIAGNOSIS — Z3480 Encounter for supervision of other normal pregnancy, unspecified trimester: Secondary | ICD-10-CM

## 2012-01-12 DIAGNOSIS — IMO0002 Reserved for concepts with insufficient information to code with codable children: Secondary | ICD-10-CM

## 2012-01-12 NOTE — Progress Notes (Signed)
S<D  Marginal cord insertion.  Pt feels small, too.  [place] ordered for growth.

## 2012-01-12 NOTE — Progress Notes (Signed)
p-89 

## 2012-01-16 ENCOUNTER — Ambulatory Visit (HOSPITAL_COMMUNITY)
Admission: RE | Admit: 2012-01-16 | Discharge: 2012-01-16 | Disposition: A | Discharge status: Home / Self Care | Payer: Medicaid Other | Source: Ambulatory Visit | Attending: Obstetrics & Gynecology | Admitting: Obstetrics & Gynecology

## 2012-01-16 DIAGNOSIS — IMO0002 Reserved for concepts with insufficient information to code with codable children: Secondary | ICD-10-CM

## 2012-01-16 DIAGNOSIS — O358XX0 Maternal care for other (suspected) fetal abnormality and damage, not applicable or unspecified: Secondary | ICD-10-CM | POA: Insufficient documentation

## 2012-01-18 ENCOUNTER — Ambulatory Visit (HOSPITAL_COMMUNITY): Payer: PRIVATE HEALTH INSURANCE

## 2012-01-26 ENCOUNTER — Encounter: Payer: Self-pay | Admitting: Obstetrics & Gynecology

## 2012-01-26 ENCOUNTER — Ambulatory Visit (INDEPENDENT_AMBULATORY_CARE_PROVIDER_SITE_OTHER): Payer: PRIVATE HEALTH INSURANCE | Admitting: Obstetrics & Gynecology

## 2012-01-26 VITALS — BP 119/69 | Temp 98.5°F | Wt 127.0 lb

## 2012-01-26 DIAGNOSIS — Z3480 Encounter for supervision of other normal pregnancy, unspecified trimester: Secondary | ICD-10-CM

## 2012-01-26 NOTE — Progress Notes (Signed)
Routine visit. 53% growth on u/s last week. Cultures today. Labor precautions. Good FM.

## 2012-01-26 NOTE — Progress Notes (Signed)
p-85  ? ROM

## 2012-01-26 NOTE — Addendum Note (Signed)
Addended by: Allie Bossier on: 01/26/2012 04:15 PM   Modules accepted: Orders

## 2012-02-02 ENCOUNTER — Encounter: Payer: Self-pay | Admitting: Obstetrics & Gynecology

## 2012-02-02 ENCOUNTER — Ambulatory Visit (INDEPENDENT_AMBULATORY_CARE_PROVIDER_SITE_OTHER): Payer: PRIVATE HEALTH INSURANCE | Admitting: Obstetrics & Gynecology

## 2012-02-02 VITALS — BP 112/64 | Wt 127.0 lb

## 2012-02-02 DIAGNOSIS — Z3480 Encounter for supervision of other normal pregnancy, unspecified trimester: Secondary | ICD-10-CM

## 2012-02-02 NOTE — Progress Notes (Signed)
Routine visit. BH ctxs. Good FM. GBS + from last week. Based on her cervical exam and GBS status, I have given her labor precautions

## 2012-02-02 NOTE — Progress Notes (Signed)
p=96 

## 2012-02-05 ENCOUNTER — Encounter (HOSPITAL_COMMUNITY): Payer: Self-pay | Admitting: Obstetrics and Gynecology

## 2012-02-05 ENCOUNTER — Inpatient Hospital Stay (HOSPITAL_COMMUNITY)
Admission: AD | Admit: 2012-02-05 | Discharge: 2012-02-07 | DRG: 373 | Disposition: A | Discharge status: Home / Self Care | Payer: BC Managed Care – PPO | Source: Ambulatory Visit | Attending: Obstetrics & Gynecology | Admitting: Obstetrics & Gynecology

## 2012-02-05 DIAGNOSIS — O09899 Supervision of other high risk pregnancies, unspecified trimester: Secondary | ICD-10-CM

## 2012-02-05 DIAGNOSIS — Z880 Allergy status to penicillin: Secondary | ICD-10-CM

## 2012-02-05 DIAGNOSIS — O99892 Other specified diseases and conditions complicating childbirth: Secondary | ICD-10-CM | POA: Diagnosis present

## 2012-02-05 DIAGNOSIS — Z3480 Encounter for supervision of other normal pregnancy, unspecified trimester: Secondary | ICD-10-CM

## 2012-02-05 DIAGNOSIS — IMO0001 Reserved for inherently not codable concepts without codable children: Secondary | ICD-10-CM

## 2012-02-05 DIAGNOSIS — Z22330 Carrier of Group B streptococcus: Secondary | ICD-10-CM

## 2012-02-05 LAB — TYPE AND SCREEN: Antibody Screen: NEGATIVE

## 2012-02-05 LAB — CBC
Hemoglobin: 12.5 g/dL (ref 12.0–15.0)
MCV: 94.8 fL (ref 78.0–100.0)
Platelets: 203 10*3/uL (ref 150–400)
RBC: 3.82 MIL/uL — ABNORMAL LOW (ref 3.87–5.11)
WBC: 10.4 10*3/uL (ref 4.0–10.5)

## 2012-02-05 MED ORDER — EPHEDRINE 5 MG/ML INJ
10.0000 mg | INTRAVENOUS | Status: DC | PRN
  Filled 2012-02-05: qty 4

## 2012-02-05 MED ORDER — PHENYLEPHRINE 40 MCG/ML (10ML) SYRINGE FOR IV PUSH (FOR BLOOD PRESSURE SUPPORT)
80.0000 ug | PREFILLED_SYRINGE | INTRAVENOUS | Status: DC | PRN
  Filled 2012-02-05: qty 5

## 2012-02-05 MED ORDER — FENTANYL 2.5 MCG/ML BUPIVACAINE 1/10 % EPIDURAL INFUSION (WH - ANES)
14.0000 mL/h | INTRAMUSCULAR | Status: DC
  Filled 2012-02-05: qty 125

## 2012-02-05 MED ORDER — LACTATED RINGERS IV SOLN: INTRAVENOUS | Status: DC

## 2012-02-05 MED ORDER — CITRIC ACID-SODIUM CITRATE 334-500 MG/5ML PO SOLN: 30.0000 mL | ORAL | Status: DC | PRN

## 2012-02-05 MED ORDER — PHENYLEPHRINE 40 MCG/ML (10ML) SYRINGE FOR IV PUSH (FOR BLOOD PRESSURE SUPPORT): 80.0000 ug | PREFILLED_SYRINGE | INTRAVENOUS | Status: DC | PRN

## 2012-02-05 MED ORDER — TERBUTALINE SULFATE 1 MG/ML IJ SOLN: 0.2500 mg | Freq: Once | INTRAMUSCULAR | Status: AC | PRN

## 2012-02-05 MED ORDER — ACETAMINOPHEN 325 MG PO TABS: 650.0000 mg | ORAL_TABLET | ORAL | Status: DC | PRN

## 2012-02-05 MED ORDER — EPHEDRINE 5 MG/ML INJ: 10.0000 mg | INTRAVENOUS | Status: DC | PRN

## 2012-02-05 MED ORDER — OXYTOCIN 40 UNITS IN LACTATED RINGERS INFUSION - SIMPLE MED
62.5000 mL/h | INTRAVENOUS | Status: DC
  Administered 2012-02-06: 500 mL/h via INTRAVENOUS

## 2012-02-05 MED ORDER — IBUPROFEN 600 MG PO TABS
600.0000 mg | ORAL_TABLET | Freq: Four times a day (QID) | ORAL | Status: DC | PRN
  Administered 2012-02-06: 600 mg via ORAL
  Filled 2012-02-05: qty 1

## 2012-02-05 MED ORDER — DIPHENHYDRAMINE HCL 50 MG/ML IJ SOLN: 12.5000 mg | INTRAMUSCULAR | Status: DC | PRN

## 2012-02-05 MED ORDER — OXYTOCIN BOLUS FROM INFUSION: 500.0000 mL | INTRAVENOUS | Status: DC

## 2012-02-05 MED ORDER — OXYTOCIN 40 UNITS IN LACTATED RINGERS INFUSION - SIMPLE MED
1.0000 m[IU]/min | INTRAVENOUS | Status: DC
  Administered 2012-02-05: 1 m[IU]/min via INTRAVENOUS
  Filled 2012-02-05: qty 1000

## 2012-02-05 MED ORDER — ONDANSETRON HCL 4 MG/2ML IJ SOLN: 4.0000 mg | Freq: Four times a day (QID) | INTRAMUSCULAR | Status: DC | PRN

## 2012-02-05 MED ORDER — LACTATED RINGERS IV SOLN
500.0000 mL | Freq: Once | INTRAVENOUS | Status: AC
  Administered 2012-02-06: 350 mL via INTRAVENOUS

## 2012-02-05 MED ORDER — OXYCODONE-ACETAMINOPHEN 5-325 MG PO TABS: 1.0000 | ORAL_TABLET | ORAL | Status: DC | PRN

## 2012-02-05 MED ORDER — FLEET ENEMA 7-19 GM/118ML RE ENEM: 1.0000 | ENEMA | RECTAL | Status: DC | PRN

## 2012-02-05 MED ORDER — FENTANYL CITRATE 0.05 MG/ML IJ SOLN
100.0000 ug | INTRAMUSCULAR | Status: DC | PRN
  Filled 2012-02-05: qty 2

## 2012-02-05 MED ORDER — CLINDAMYCIN PHOSPHATE 900 MG/50ML IV SOLN
900.0000 mg | Freq: Three times a day (TID) | INTRAVENOUS | Status: DC
  Administered 2012-02-05: 900 mg via INTRAVENOUS
  Filled 2012-02-05 (×3): qty 50

## 2012-02-05 MED ORDER — LIDOCAINE HCL (PF) 1 % IJ SOLN
30.0000 mL | INTRAMUSCULAR | Status: DC | PRN
  Administered 2012-02-06: 30 mL via SUBCUTANEOUS
  Filled 2012-02-05: qty 30

## 2012-02-05 MED ORDER — LACTATED RINGERS IV SOLN: 500.0000 mL | INTRAVENOUS | Status: DC | PRN

## 2012-02-05 NOTE — Progress Notes (Signed)
Haley [name] is a 26 y.o. G3P1011 at [redacted]w[redacted]d Subjective: Feels contractions have gotten closer together again since ambulating. Pain with contractions but not requiring IV medications yet. States she did not need Pitocin at her last delivery and was AROMed around 7-8 cm, then delivered within 20-30 minutes.  Objective: BP 111/57  Pulse 74  Temp 98.2 F (36.8 C) (Oral)  Resp 18  Ht 5' 0.5" (1.537 m)  Wt 57.153 kg (126 lb)  BMI 24.20 kg/m2  LMP 05/15/2011  Breastfeeding? Unknown      FHT:  FHR: 150 bpm, variability: moderate,  accelerations:  Present,  decelerations:  Present variables  Baseline slightly difficult to interpret with many accels and few variable decels UC:   Regular, every 4-6 minutes but some poor pick-up on tocometry SVE:   Dilation: 5.5 Effacement (%): 80 Station: -2 Exam by:: Dr c. Casper Harrison and A. Clearance Coots, RNc-ob  Labs: Lab Results  Component Value Date   WBC 10.4 02/05/2012   HGB 12.5 02/05/2012   HCT 36.2 02/05/2012   MCV 94.8 02/05/2012   PLT 203 02/05/2012    Assessment / Plan: Advanced dilation vs spontaneous labor, progressing slowly with little change in the last 2 hours GBS positive and PCN allergic  Labor: Will start Pitocin; will consider AROM based off further cervical change Preeclampsia:  no signs or symptoms of toxicity Fetal Wellbeing:  Category II Pain Control:  Fentanyl ordered, pt has not requested as of yet I/D:  Clindamycin Anticipated MOD:  NSVD  Donielle Radziewicz, Cristal Deer 02/05/2012, 9:41 PM

## 2012-02-05 NOTE — Progress Notes (Signed)
Azalea Cedar is a 26 y.o. G3P1011 at [redacted]w[redacted]d   Subjective: Feels ctx are spacing out; rec'd first dose Clinda  Objective: BP 101/52  Pulse 72  Temp 98.6 F (37 C) (Oral)  Resp 18  Ht 5' 0.5" (1.537 m)  Wt 57.153 kg (126 lb)  BMI 24.20 kg/m2  LMP 05/15/2011  Breastfeeding? Unknown      FHT:  FHR: 130 bpm, variability: moderate,  accelerations:  Present,  decelerations:  Absent UC:   irregular, every 6-10 minutes SVE:   Dilation: 5.5 Effacement (%): 80 Station: -2 Exam by:: Pincus Badder CNM  Labs: Lab Results  Component Value Date   WBC 10.4 02/05/2012   HGB 12.5 02/05/2012   HCT 36.2 02/05/2012   MCV 94.8 02/05/2012   PLT 203 02/05/2012    Assessment / Plan: IUP at 38.0wks Advanced dilation vs latent labor GBS +  Discussed with pt the recommendation to walk and move to try and stimulate ctx  Cam Hai 02/05/2012, 7:03 PM

## 2012-02-05 NOTE — MAU Note (Signed)
"  I started contracting about 1130 every 3-5 mins.  No bleeding or leaking of fluid.  On Thursday I was seen at the [facility] in [place] and was dilated 5cm."

## 2012-02-05 NOTE — H&P (Signed)
Haley [name] is a 26 y.o. female presenting for labor evaluation.  She is having contractions 3-4 minutes apart.  She reports good fetal movement, denies LOF, vaginal bleeding, vaginal itching/burning, urinary symptoms, h/a, dizziness, n/v, or fever/chills.    Maternal Medical History:  Reason for admission: Reason for admission: contractions.  Reason for Admission:   nauseaContractions: Onset was 1-2 hours ago.   Frequency: regular.   Duration is approximately 1 minute.   Perceived severity is strong.    Fetal activity: Perceived fetal activity is normal.   Last perceived fetal movement was within the past hour.    Prenatal complications: no prenatal complications Prenatal Complications - Diabetes: none.    OB History    Grav Para Term Preterm Abortions TAB SAB Ect Mult Living   3 1 1  0 1 1 0 0 0 1     Past Medical History  Diagnosis Date  . No pertinent past medical history    Past Surgical History  Procedure Date  . Wisdom tooth extraction   . Wisdom tooth extraction    Family History: family history includes Asthma in her mother and Diabetes in her father. Social History:  reports that she has never smoked. She has never used smokeless tobacco. She reports that she does not drink alcohol or use illicit drugs.   Prenatal Transfer Tool  Maternal Diabetes: No Genetic Screening: Declined Maternal Ultrasounds/Referrals: Normal  except post Succincuriate lobe of placenta w/ marginal cord insert Fetal Ultrasounds or other Referrals:  None Maternal Substance Abuse:  No Significant Maternal Medications:  None Significant Maternal Lab Results:  Lab values include: Group B Strep positive Other Comments:  None  Review of Systems  Constitutional: Negative for fever, chills and malaise/fatigue.  Eyes: Negative for blurred vision.  Respiratory: Negative for cough and shortness of breath.   Cardiovascular: Negative for chest pain.  Gastrointestinal: Positive for abdominal  pain. Negative for heartburn, nausea and vomiting.  Genitourinary: Negative for dysuria, urgency and frequency.  Musculoskeletal: Negative.   Neurological: Negative for dizziness and headaches.  Psychiatric/Behavioral: Negative for depression.    Dilation: 6 Effacement (%): 90 Station: -2;-1 Exam by:: Raelyn Mora, RN Blood pressure 128/68, pulse 85, temperature 98.3 F (36.8 C), temperature source Oral, resp. rate 18, height 5' 0.5" (1.537 m), weight 57.516 kg (126 lb 12.8 oz), last menstrual period 05/15/2011, unknown if currently breastfeeding.  EFW 2491 5lb8oz, 53% on 01/16/12  Normal anatomy U/S except posterior succincuriate lobe of placenta w/ marginal cord insert  Maternal Exam:  Uterine Assessment: Contraction strength is moderate.  Contraction duration is 70 seconds. Contraction frequency is regular.   Abdomen: Patient reports no abdominal tenderness. Fetal presentation: vertex  Pelvis: adequate for delivery.   Cervix: Cervix evaluated by digital exam.     Fetal Exam Fetal Monitor Review: Mode: ultrasound.   Baseline rate: 130.  Variability: moderate (6-25 bpm).   Pattern: accelerations present.    Fetal State Assessment: Category I - tracings are normal.     Physical Exam  Nursing note and vitals reviewed. Constitutional: She is oriented to person, place, and time. She appears well-developed and well-nourished.  Neck: Normal range of motion.  Cardiovascular: Normal rate and regular rhythm.   Respiratory: Effort normal and breath sounds normal.  GI: Soft.  Musculoskeletal: Normal range of motion.  Neurological: She is alert and oriented to person, place, and time. She has normal reflexes.  Skin: Skin is warm and dry.  Psychiatric: She has a normal mood and affect. Her  behavior is normal. Judgment and thought content normal.    Prenatal labs: ABO, Rh: --/--/O POS (05/20 1526) Antibody:   Rubella:   RPR: NON REAC (10/23 1141)  HBsAg:    HIV: NON  REACTIVE (10/23 1141)  GBS:   Positive 1 hour GTT 96  Assessment/Plan: Active labor GBS positive/PCN allergic  Admit to birthing suites Clindamycin for GBS prophylaxis Expectant management Anticipate NSVD  LEFTWICH-KIRBY, Haley [name] 02/05/2012, 4:02 PM

## 2012-02-05 NOTE — MAU Note (Signed)
p reports having ctx since 1140 thi morning. Denies SROM or bleeding at this time

## 2012-02-06 ENCOUNTER — Encounter (HOSPITAL_COMMUNITY): Payer: Self-pay | Admitting: *Deleted

## 2012-02-06 LAB — RUBELLA SCREEN: Rubella: 9.65 Index — ABNORMAL HIGH (ref <0.90)

## 2012-02-06 LAB — RPR: RPR Ser Ql: NONREACTIVE

## 2012-02-06 MED ORDER — WITCH HAZEL-GLYCERIN EX PADS: 1.0000 "application " | MEDICATED_PAD | CUTANEOUS | Status: DC | PRN

## 2012-02-06 MED ORDER — OXYCODONE-ACETAMINOPHEN 5-325 MG PO TABS
1.0000 | ORAL_TABLET | ORAL | Status: DC | PRN
  Administered 2012-02-06: 1 via ORAL
  Filled 2012-02-06: qty 1

## 2012-02-06 MED ORDER — ZOLPIDEM TARTRATE 5 MG PO TABS: 5.0000 mg | ORAL_TABLET | Freq: Every evening | ORAL | Status: DC | PRN

## 2012-02-06 MED ORDER — PRENATAL MULTIVITAMIN CH
1.0000 | ORAL_TABLET | Freq: Every day | ORAL | Status: DC
  Administered 2012-02-06 – 2012-02-07 (×2): 1 via ORAL
  Filled 2012-02-06 (×2): qty 1

## 2012-02-06 MED ORDER — TETANUS-DIPHTH-ACELL PERTUSSIS 5-2.5-18.5 LF-MCG/0.5 IM SUSP: 0.5000 mL | Freq: Once | INTRAMUSCULAR | Status: DC

## 2012-02-06 MED ORDER — DIBUCAINE 1 % RE OINT: 1.0000 "application " | TOPICAL_OINTMENT | RECTAL | Status: DC | PRN

## 2012-02-06 MED ORDER — SENNOSIDES-DOCUSATE SODIUM 8.6-50 MG PO TABS
2.0000 | ORAL_TABLET | Freq: Every day | ORAL | Status: DC
  Administered 2012-02-06: 2 via ORAL

## 2012-02-06 MED ORDER — DIPHENHYDRAMINE HCL 25 MG PO CAPS: 25.0000 mg | ORAL_CAPSULE | Freq: Four times a day (QID) | ORAL | Status: DC | PRN

## 2012-02-06 MED ORDER — IBUPROFEN 600 MG PO TABS
600.0000 mg | ORAL_TABLET | Freq: Four times a day (QID) | ORAL | Status: DC
  Administered 2012-02-06 – 2012-02-07 (×5): 600 mg via ORAL
  Filled 2012-02-06 (×5): qty 1

## 2012-02-06 MED ORDER — ONDANSETRON HCL 4 MG/2ML IJ SOLN: 4.0000 mg | INTRAMUSCULAR | Status: DC | PRN

## 2012-02-06 MED ORDER — LANOLIN HYDROUS EX OINT: TOPICAL_OINTMENT | CUTANEOUS | Status: DC | PRN

## 2012-02-06 MED ORDER — ONDANSETRON HCL 4 MG PO TABS: 4.0000 mg | ORAL_TABLET | ORAL | Status: DC | PRN

## 2012-02-06 MED ORDER — SIMETHICONE 80 MG PO CHEW: 80.0000 mg | CHEWABLE_TABLET | ORAL | Status: DC | PRN

## 2012-02-06 MED ORDER — BENZOCAINE-MENTHOL 20-0.5 % EX AERO: 1.0000 "application " | INHALATION_SPRAY | CUTANEOUS | Status: DC | PRN

## 2012-02-06 NOTE — Progress Notes (Signed)
Haley [name] is a 26 y.o. G3P1011 at 38w1 Subjective: More uncomfortable with contractions since starting Pitocin, but no other complaints.  Objective: BP 101/52  Pulse 65  Temp 98.5 F (36.9 C) (Oral)  Resp 18  Ht 5' 0.5" (1.537 m)  Wt 57.153 kg (126 lb)  BMI 24.20 kg/m2  LMP 05/15/2011  Breastfeeding? Unknown      FHT:  FHR: 115-120 bpm, variability: moderate,  accelerations:  Present,  decelerations:  Present occasional variables UC:   regular, every 3-5 minutes, few couplets Of note, FHT and tocometry easier to interpret than previous check SVE:   Dilation: 7 Effacement (%): 80 Station: -2 Exam by:: Dr C. Chey Rachels  Labs: Lab Results  Component Value Date   WBC 10.4 02/05/2012   HGB 12.5 02/05/2012   HCT 36.2 02/05/2012   MCV 94.8 02/05/2012   PLT 203 02/05/2012    Assessment / Plan: Augmentation of labor, progressing well GBS positive, PCN allergic AROM at 0041  Labor: Progressing on Pit Preeclampsia:  no signs or symptoms of toxicity Fetal Wellbeing:  Category I Pain Control:  Fentanyl ordered, pt now requesting epidural I/D:  Clindamycin Anticipated MOD:  NSVD  Lorrin Nawrot, Cristal Deer 02/06/2012, 12:53 AM

## 2012-02-07 DIAGNOSIS — O9989 Other specified diseases and conditions complicating pregnancy, childbirth and the puerperium: Secondary | ICD-10-CM

## 2012-02-07 DIAGNOSIS — Z22330 Carrier of Group B streptococcus: Secondary | ICD-10-CM

## 2012-02-07 DIAGNOSIS — O99892 Other specified diseases and conditions complicating childbirth: Secondary | ICD-10-CM

## 2012-02-07 MED ORDER — IBUPROFEN 600 MG PO TABS: 600.0000 mg | ORAL_TABLET | Freq: Four times a day (QID) | ORAL | Status: DC

## 2012-02-07 NOTE — Discharge Summary (Signed)
Obstetric Discharge Summary Reason for Admission: onset of labor- latent phase with advanced dilation Prenatal Procedures: none Intrapartum Procedures: spontaneous vaginal delivery Postpartum Procedures: none Complications-Operative and Postpartum: none Hemoglobin  Date Value Range Status  02/05/2012 12.5  12.0 - 15.0 g/dL Final     HCT  Date Value Range Status  02/05/2012 36.2  36.0 - 46.0 % Final    Physical Exam:  General: alert, cooperative and no distress Lochia: appropriate Uterine Fundus: firm DVT Evaluation: No evidence of DVT seen on physical exam.  Discharge Diagnoses: Term Pregnancy-delivered  Discharge Information: Date: 02/07/2012 Activity: pelvic rest Diet: routine Medications: PNV and Ibuprofen Condition: stable Instructions: refer to practice specific booklet Discharge to: home Follow-up Information    Follow up with [facility] [place]. (Make an appointment for 4-6 weeks postpartum)    Contact information:   [address], [address] [place] [address] [phone]         Newborn Data: Live born female  Birth Weight: 6 lb 0.1 oz (2725 g) APGAR: 9, 9  Home with mother. Breastfeeding going well. Considering Nuvaring for contraception (ensure milk is well-established first).  Cam Hai 02/07/2012, 7:38 AM

## 2012-02-07 NOTE — Discharge Summary (Signed)
Attestation of Attending Supervision of Advanced Practitioner (CNM/NP): Evaluation and management procedures were performed by the Advanced Practitioner under my supervision and collaboration.  I have reviewed the Advanced Practitioner's note and chart, and I agree with the management and plan.  Red Mandt 02/07/2012 7:53 AM   

## 2012-03-19 ENCOUNTER — Encounter: Payer: Self-pay | Admitting: Advanced Practice Midwife

## 2012-03-19 ENCOUNTER — Ambulatory Visit (INDEPENDENT_AMBULATORY_CARE_PROVIDER_SITE_OTHER): Payer: 59 | Admitting: Advanced Practice Midwife

## 2012-03-19 NOTE — Patient Instructions (Signed)

## 2012-03-19 NOTE — Progress Notes (Signed)
Patient ID: Haley [name], female   DOB: 1985-04-23, 27 y.o.   MRN: [id number] Subjective:     Haley [name] is a 27 y.o. female who presents for a postpartum visit. She is 6 weeks postpartum following a spontaneous vaginal delivery. I have fully reviewed the prenatal and intrapartum course. The delivery was at 38 gestational weeks. Outcome: spontaneous vaginal delivery. Anesthesia: IV sedation. Postpartum course has been normal. Baby's course has been normal. Baby is feeding by breast. Bleeding no bleeding. Bowel function is normal. Bladder function is normal. Patient is sexually active. Contraception method is none. Postpartum depression screening: negative.  The following portions of the patient's history were reviewed and updated as appropriate: allergies, current medications, past family history, past medical history, past social history, past surgical history and problem list.  Review of Systems Pertinent items are noted in HPI.   Objective:    BP 118/73  Pulse 53  Wt 111 lb (50.349 kg)  BMI 21.31 kg/m2  Breastfeeding? Yes       Physical Exam  Nursing note and vitals reviewed. Constitutional: She is oriented to person, place, and time. She appears well-developed and well-nourished. No distress.  Cardiovascular: Normal rate.   Pulmonary/Chest: Effort normal.  Abdominal: Soft. There is no tenderness.  Neurological: She is alert and oriented to person, place, and time.  Skin: Skin is warm and dry.  Psychiatric: She has a normal mood and affect.    Assessment:     6 week postpartum exam. Pap smear not due at today's visit.   Plan:    1. Contraception: Nexplanon 2. Follow up in: 1 week for nexplanon or as needed.  Pap next year.

## 2012-03-27 ENCOUNTER — Ambulatory Visit (INDEPENDENT_AMBULATORY_CARE_PROVIDER_SITE_OTHER): Payer: 59 | Admitting: Obstetrics & Gynecology

## 2012-03-27 VITALS — BP 131/90 | HR 79 | Temp 97.3°F | Resp 20 | Ht 60.5 in | Wt 111.0 lb

## 2012-03-27 DIAGNOSIS — Z309 Encounter for contraceptive management, unspecified: Secondary | ICD-10-CM

## 2012-03-27 MED ORDER — NORETHINDRONE 0.35 MG PO TABS: 1.0000 | ORAL_TABLET | Freq: Every day | ORAL | Status: DC

## 2012-03-27 MED ORDER — LEVONORGESTREL 1.5 MG PO TABS: 1.0000 | ORAL_TABLET | Freq: Once | ORAL | Status: DC

## 2012-03-27 NOTE — Progress Notes (Signed)
Pt states she had unprotected intercourse 2 weeks ago and again today. Dr. Macon Large notified

## 2012-03-27 NOTE — Patient Instructions (Signed)
Emergency Contraception Emergency contraceptives prevent pregnancy after unprotected sexual intercourse. It can also be used when:  A condom breaks.  After a sexual assault.  You forgot to take your birth control pills.  When inadequate protection with sexual intercourse occurs. Usually, emergency contraception is a pill taken right after sex or up to 5 days after unprotected sex. It is most effective the sooner you take the pills after having sexual intercourse. Emergency contraceptive pills are available without a prescription. Check with your pharmacist. Do not use emergency contraception as your only form of birth control. They do not protect against sexually transmitted infections (STIs).  Emergency contraception will not work if you are already pregnant and will not harm the baby if you are pregnant. Emergency contraception does not cause an abortion. They work by preventing the ovaries from releasing an egg (ovulation) or the fertilization of an egg. Taking St. John's Wort, certain antibiotic medicines, and certain anti-convulsion medications may make these pills less effective. Discuss with your caregiver the possible side effects of emergency contraceptives. TYPES OF EMERGENCY CONTRACEPTIVES  Some types of emergency contraceptive pills contain estrogen and progesterone in higher doses.  Some types use 2 pills with a high dose of progesterone.  Some types use 1 pill with a double dose of progesterone.  An intrauterine device (IUD) may be used.This T-shaped device is also used as a form of birth control. It is inserted into the uterus to prevent pregnancy. The copper IUD can also be used as emergency contraception if inserted within 5 days of having unprotected intercourse. HOME CARE INSTRUCTIONS   Eat something before taking the emergency contraceptive pills.  Lie down for a couple of hours if you become tired or dizzy.  Continue using birth control until you start your menstrual  period. SEEK MEDICAL CARE IF:   You throw up (vomit) within 2 hours after taking the pill. You will have to take another pill.  You need treatment for nausea, vomiting, headache, or belly (abdominal) cramps.  You have not had a menstrual period 21 days after taking the pill.  You are having irregular bleeding or spotting. SEEK IMMEDIATE MEDICAL CARE IF:   You have chest pain.  You have leg pain.  You have numbness or weakness of your arms or legs.  You have slurred speech.  You have visual problems. Document Released: 04/04/2001 Document Revised: 04/18/2011 Document Reviewed: 05/29/2010 [facility] Patient Information 2013 [place], [place].  Oral Contraception Information Oral contraceptives (OCs) are medicines taken to prevent pregnancy. OCs work by preventing the ovaries from releasing eggs. The hormones in OCs also cause the cervical mucus to thicken, preventing the sperm from entering the uterus. The hormones also cause the uterine lining to become thin, not allowing a fertilized egg to attach to the inside of the uterus. OCs are highly effective when taken exactly as prescribed. However, OCs do not prevent sexually transmitted diseases (STDs). Safe sex practices, such as using condoms along with the pill, can help prevent STDs.  Before taking the pill, you may have a physical exam and Pap test. Your caregiver may order blood tests that may be necessary. Your caregiver will make sure you are a good candidate for oral contraception. Discuss with your caregiver the possible side effects of the OC you may be prescribed. When starting an OC, it can take 2 to 3 months for the body to adjust to the changes in hormone levels in your body.  TYPES OF ORAL CONTRACEPTION  The combination pill.  This pill contains estrogen and progestin (synthetic progesterone) hormones. The combination pill comes in either 21-day or 28-day packs. With 21-day packs, you do not take pills for 7 days after the last  pill. With 28-day packs, the pill is taken every day. The last 7 pills are without hormones. Certain types of pills have more than 21 hormone-containing pills.  The minipill. This pill contains the progesterone hormone only. It is taken every day continuously. The minipill comes in packs of 91 pills. The first 84 pills contain the hormones, and the last 7 pills do not. The last 7 days are when you will have your menstrual period. You may experience irregular spotting. ADVANTAGES  Decreases premenstrual symptoms.  Treats menstrual period cramps.  Regulates the menstrual cycle.  Decreases a heavy menstrual flow.  Treats acne.  Treats abnormal uterine bleeding.  Treats chronic pelvic pain.  Treats polycystic ovarian syndrome.  Treats endometriosis.  Can be used as emergency contraception. DISADVANTAGES OCs can be less effective if:  You forget to take the pill at the same time every day.  You have a stomach or intestinal disease that lessens the absorption of the pill.  You take OCs with other medicines that make OCs less effective.  You take expired OCs.  You forget to restart the pill on day 7, when using the packs of 21 pills. Document Released: 04/16/2002 Document Revised: 04/18/2011 Document Reviewed: 06/02/2010 [facility] Patient Information 2013 [place], [place].

## 2012-03-27 NOTE — Progress Notes (Signed)
History:  27 y.o. [id number] here today for Nexpalnon placement s/p SVD on 02/06/12.  She had unprotected intercourse in the last two weeks and this morning.     The following portions of the patient's history were reviewed and updated as appropriate: allergies, current medications, past family history, past medical history, past social history, past surgical history and problem list. Last pap was in 03/2010,   Review of Systems:  Pertinent items are noted in HPI.  Objective:  Physical Exam BP 131/90  Pulse 79  Temp(Src) 97.3 F (36.3 C) (Oral)  Resp 20  Ht 5' 0.5" (1.537 m)  Wt 111 lb (50.349 kg)  BMI 21.31 kg/m2 Deferred  Assessment & Plan:  Patient here for Nexplanon insertion but had recent unprotected intercourse.  She was informed that our protocol was to have two weeks without no unprotected intercourse. After detailed discussion, offered her Plan B today and insertion today but advised that there was still a risk of ongoing pregnancy.  Patient declined procedure today, wants to have the Plan B and start on progestin oral pills.  These were prescribed.  She will return for any concerning symptoms or for her annual exam.

## 2012-04-19 ENCOUNTER — Telehealth: Payer: Self-pay

## 2012-04-19 NOTE — Telephone Encounter (Signed)
Pt called to see if she can get clindamycin changed for the mastitis to another medicine because the medicine make her have loose bowels. Per Dr. Macon Large ok to change to Bactrim DS 1 tab twice a day for 14 days. Called into [facility] in [place] [phone].

## 2013-06-14 ENCOUNTER — Ambulatory Visit: Payer: 59 | Admitting: Family

## 2013-06-17 ENCOUNTER — Other Ambulatory Visit: Payer: Self-pay | Admitting: Obstetrics & Gynecology

## 2013-06-17 ENCOUNTER — Encounter: Payer: Self-pay | Admitting: Obstetrics & Gynecology

## 2013-06-17 ENCOUNTER — Ambulatory Visit (INDEPENDENT_AMBULATORY_CARE_PROVIDER_SITE_OTHER): Payer: 59 | Admitting: Obstetrics & Gynecology

## 2013-06-17 VITALS — BP 123/75 | HR 62 | Ht 60.0 in | Wt 109.0 lb

## 2013-06-17 DIAGNOSIS — Z01419 Encounter for gynecological examination (general) (routine) without abnormal findings: Secondary | ICD-10-CM

## 2013-06-17 DIAGNOSIS — Z0000 Encounter for general adult medical examination without abnormal findings: Secondary | ICD-10-CM

## 2013-06-17 DIAGNOSIS — Z124 Encounter for screening for malignant neoplasm of cervix: Secondary | ICD-10-CM

## 2013-06-17 DIAGNOSIS — Z113 Encounter for screening for infections with a predominantly sexual mode of transmission: Secondary | ICD-10-CM

## 2013-06-17 LAB — COMPREHENSIVE METABOLIC PANEL
ALK PHOS: 56 U/L (ref 39–117)
ALT: 8 U/L (ref 0–35)
AST: 14 U/L (ref 0–37)
Albumin: 4 g/dL (ref 3.5–5.2)
BILIRUBIN TOTAL: 0.9 mg/dL (ref 0.2–1.2)
BUN: 8 mg/dL (ref 6–23)
CO2: 27 mEq/L (ref 19–32)
Calcium: 9.5 mg/dL (ref 8.4–10.5)
Chloride: 103 mEq/L (ref 96–112)
Creat: 0.76 mg/dL (ref 0.50–1.10)
GLUCOSE: 99 mg/dL (ref 70–99)
POTASSIUM: 3.8 meq/L (ref 3.5–5.3)
SODIUM: 137 meq/L (ref 135–145)
TOTAL PROTEIN: 7.1 g/dL (ref 6.0–8.3)

## 2013-06-17 LAB — CBC
HEMATOCRIT: 35.4 % — AB (ref 36.0–46.0)
Hemoglobin: 12.2 g/dL (ref 12.0–15.0)
MCH: 31.4 pg (ref 26.0–34.0)
MCHC: 34.5 g/dL (ref 30.0–36.0)
MCV: 91.2 fL (ref 78.0–100.0)
Platelets: 289 10*3/uL (ref 150–400)
RBC: 3.88 MIL/uL (ref 3.87–5.11)
RDW: 13.3 % (ref 11.5–15.5)
WBC: 5.9 10*3/uL (ref 4.0–10.5)

## 2013-06-17 LAB — TSH: TSH: 0.893 u[IU]/mL (ref 0.350–4.500)

## 2013-06-17 NOTE — Progress Notes (Signed)
Subjective:    Haley [name] is a 28 y.o. female who presents for an annual exam. The patient has no complaints today except she is feeling moody during her ovulation week. She also complains of decreased libido. The patient is sexually active. GYN screening history: last pap: was normal. The patient wears seatbelts: yes. The patient participates in regular exercise: yes. Has the patient ever been transfused or tattooed?: yes. The patient reports that there is not domestic violence in her life.   Menstrual History: OB History   Grav Para Term Preterm Abortions TAB SAB Ect Mult Living   3 2 2  0 1 1 0 0 0 2      Menarche age: 89  Patient's last menstrual period was 06/06/2013.    The following portions of the patient's history were reviewed and updated as appropriate: allergies, current medications, past family history, past medical history, past social history, past surgical history and problem list.  Review of Systems A comprehensive review of systems was negative.    Objective:    BP 123/75  Pulse 62  Ht 5' (1.524 m)  Wt 109 lb (49.442 kg)  BMI 21.29 kg/m2  LMP 06/06/2013  Breastfeeding? No  General Appearance:    Alert, cooperative, no distress, appears stated age  Head:    Normocephalic, without obvious abnormality, atraumatic  Eyes:    PERRL, conjunctiva/corneas clear, EOM's intact, fundi    benign, both eyes  Ears:    Normal TM's and external ear canals, both ears  Nose:   Nares normal, septum midline, mucosa normal, no drainage    or sinus tenderness  Throat:   Lips, mucosa, and tongue normal; teeth and gums normal  Neck:   Supple, symmetrical, trachea midline, no adenopathy;    thyroid:  no enlargement/tenderness/nodules; no carotid   bruit or JVD  Back:     Symmetric, no curvature, ROM normal, no CVA tenderness  Lungs:     Clear to auscultation bilaterally, respirations unlabored  Chest Wall:    No tenderness or deformity   Heart:    Regular rate and rhythm, S1  and S2 normal, no murmur, rub   or gallop  Breast Exam:    No tenderness, masses, or nipple abnormality  Abdomen:     Soft, non-tender, bowel sounds active all four quadrants,    no masses, no organomegaly  Genitalia:    Normal female without lesion, discharge or tenderness, NSSA, NT, normal adnexal exam     Extremities:   Extremities normal, atraumatic, no cyanosis or edema  Pulses:   2+ and symmetric all extremities  Skin:   Skin color, texture, turgor normal, no rashes or lesions  Lymph nodes:   Cervical, supraclavicular, and axillary nodes normal  Neurologic:   CNII-XII intact, normal strength, sensation and reflexes    throughout  .    Assessment:    Healthy female exam.    Plan:     Breast self exam technique reviewed and patient encouraged to perform self-exam monthly. Chlamydia specimen. GC specimen. Thin prep Pap smear.

## 2013-06-18 LAB — TESTOSTERONE, FREE, TOTAL, SHBG
SEX HORMONE BINDING: 53 nmol/L (ref 18–114)
TESTOSTERONE FREE: 7.9 pg/mL — AB (ref 0.6–6.8)
TESTOSTERONE: 59 ng/dL (ref 10–70)
Testosterone-% Free: 1.3 % (ref 0.4–2.4)

## 2013-06-20 LAB — HEPATITIS B SURFACE ANTIBODY, QUANTITATIVE: Hepatitis B-Post: 29.5 m[IU]/mL

## 2013-06-21 LAB — VITAMIN D 25 HYDROXY (VIT D DEFICIENCY, FRACTURES): VIT D 25 HYDROXY: 37 ng/mL (ref 30–89)

## 2013-06-24 ENCOUNTER — Telehealth: Payer: Self-pay | Admitting: *Deleted

## 2013-06-24 NOTE — Telephone Encounter (Signed)
Went over lab results with patient and the only test out of range is her testosterone level.  Is there anything she needs to do regarding this being a little bit higher than the reference range suggests.

## 2013-07-10 ENCOUNTER — Telehealth: Payer: Self-pay | Admitting: *Deleted

## 2013-07-10 NOTE — Telephone Encounter (Signed)
Message copied by Erik Obey on Wed Jul 10, 2013  2:52 PM ------      Message from: Clovia Cuff C      Created: Wed Jul 10, 2013  8:41 AM       She should come in for an appt to discuss treatment.      Thanks ------

## 2013-07-10 NOTE — Telephone Encounter (Signed)
Left message for patient regarding test results and need to make an appointment with Dr. Hulan Fray to follow up.

## 2013-07-16 ENCOUNTER — Encounter: Payer: Self-pay | Admitting: Obstetrics & Gynecology

## 2013-07-16 ENCOUNTER — Ambulatory Visit (INDEPENDENT_AMBULATORY_CARE_PROVIDER_SITE_OTHER): Payer: 59 | Admitting: Obstetrics & Gynecology

## 2013-07-16 VITALS — BP 127/72 | HR 70 | Ht 60.5 in | Wt 111.0 lb

## 2013-07-16 DIAGNOSIS — E349 Endocrine disorder, unspecified: Secondary | ICD-10-CM

## 2013-07-16 DIAGNOSIS — R7989 Other specified abnormal findings of blood chemistry: Secondary | ICD-10-CM

## 2013-07-16 DIAGNOSIS — Z111 Encounter for screening for respiratory tuberculosis: Secondary | ICD-10-CM

## 2013-07-16 DIAGNOSIS — IMO0001 Reserved for inherently not codable concepts without codable children: Secondary | ICD-10-CM

## 2013-07-16 NOTE — Addendum Note (Signed)
Addended by: Erik Obey on: 07/16/2013 08:45 AM   Modules accepted: Orders

## 2013-07-16 NOTE — Progress Notes (Signed)
   Subjective:    Patient ID: Haley [name], female    DOB: 07-25-1985, 28 y.o.   MRN: [id number]  HPI  28 yo AA P2 is here today to discuss her slightly elevated free testosterone level. This was initially drawn because she requested it due to her self reported low libido. 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  Review of Systems     Objective:   Physical Exam        Assessment & Plan:  I have offered her treatment with spironolactone or drosperidone-containing OCPs. She has opted for no treatment at this time. I did offer to recheck the level in 6 months.

## 2013-07-19 ENCOUNTER — Other Ambulatory Visit: Payer: 59 | Admitting: *Deleted

## 2013-07-19 NOTE — Progress Notes (Signed)
Patient is here to have tb test read today.  Left forearm is negative for TB.

## 2013-08-02 ENCOUNTER — Ambulatory Visit: Payer: 59 | Admitting: Obstetrics & Gynecology

## 2013-08-28 IMAGING — US US OB DETAIL+14 WK
1 series · 12 of 28 positions shown · non-contrast
Comparison: none

[Series 1: us ob detail +14 wk · 12 of 94 slices shown]
[im 4/94]
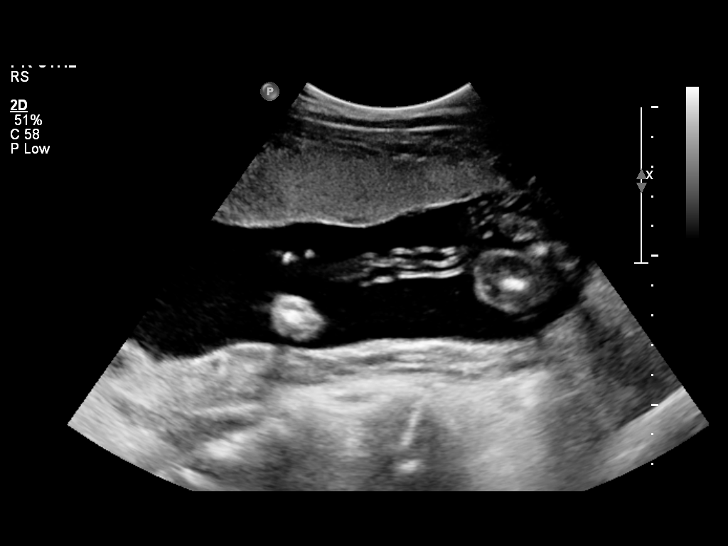
[im 11/94]
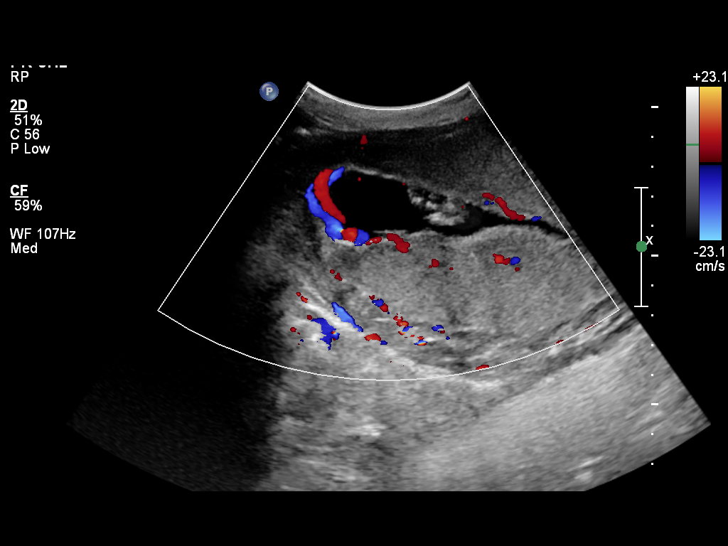
[im 18/94]
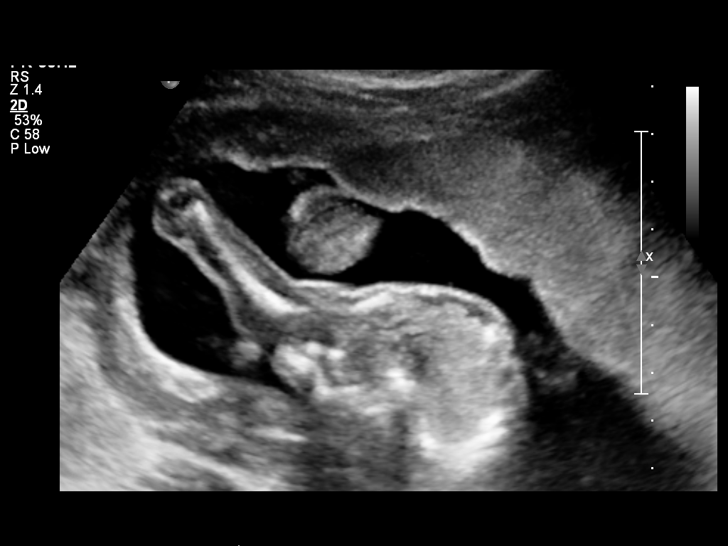
[im 28/94]
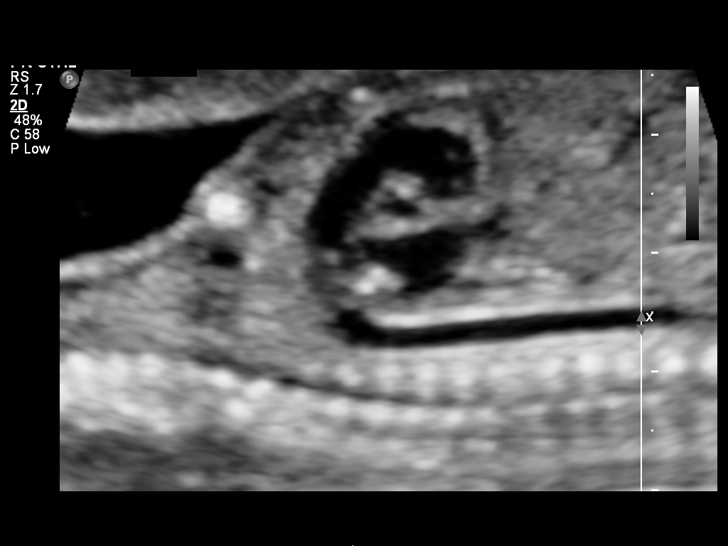
[im 35/94]
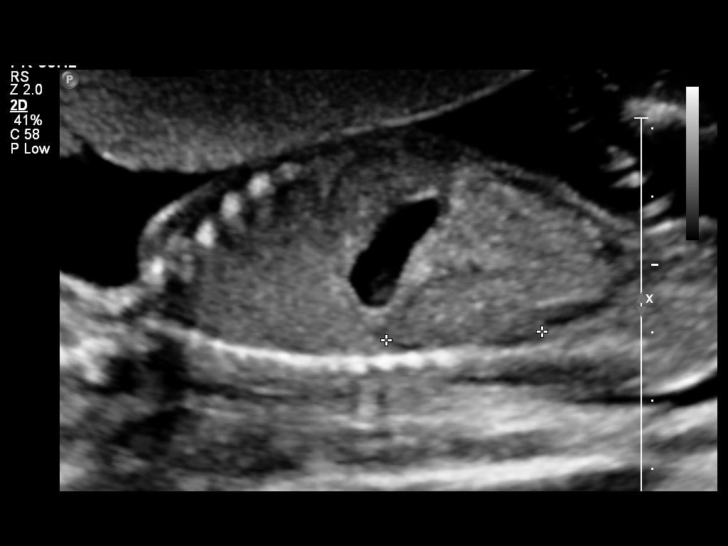
[im 42/94]
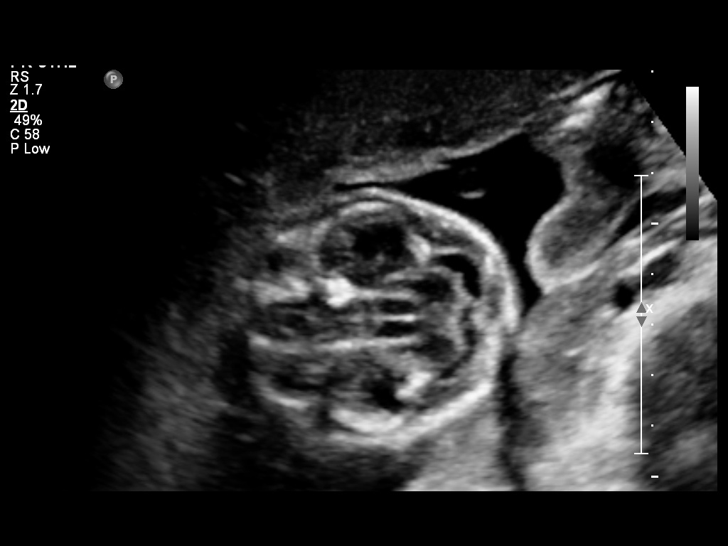
[im 52/94]
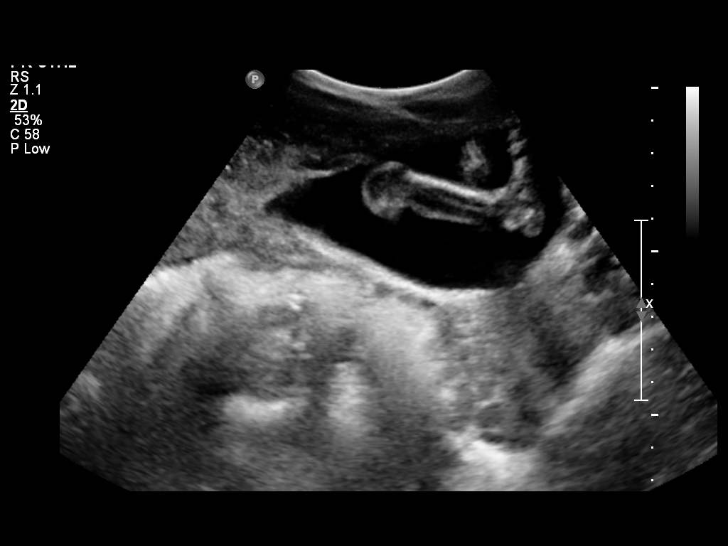
[im 59/94]
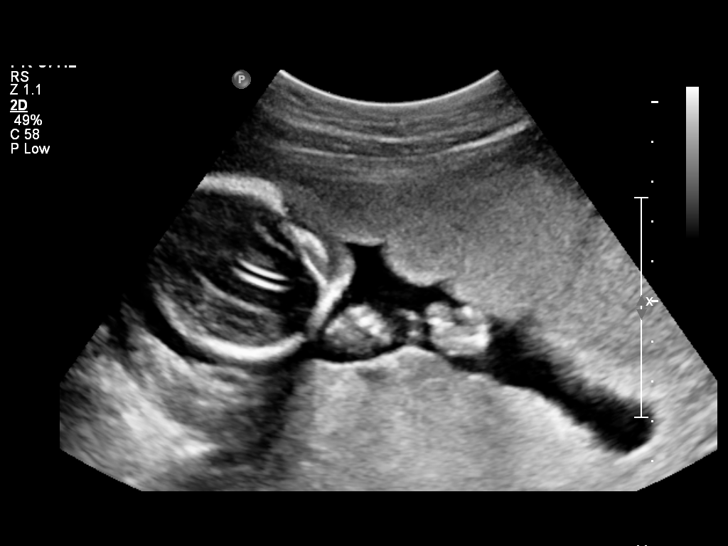
[im 66/94]
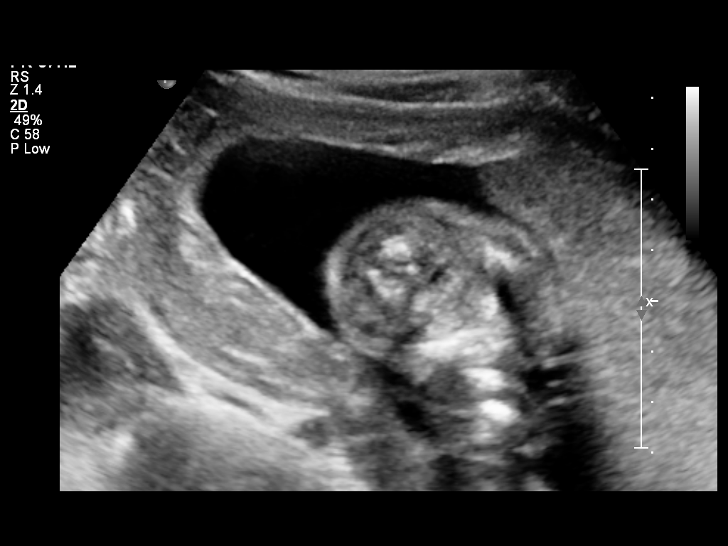
[im 76/94]
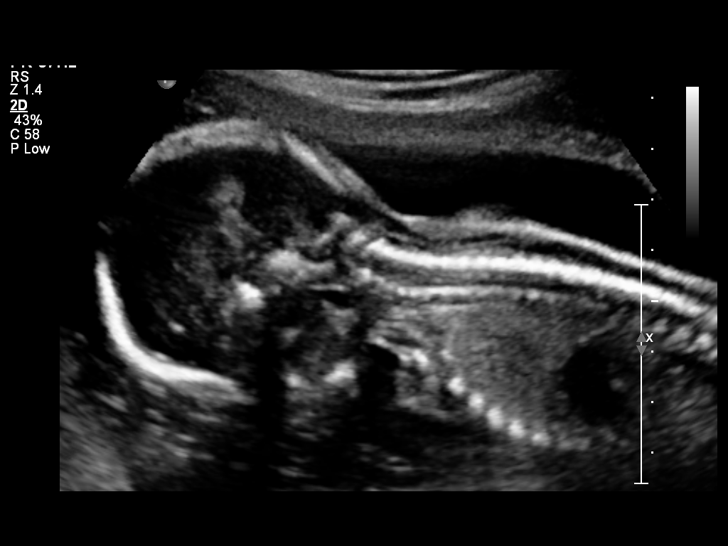
[im 83/94]
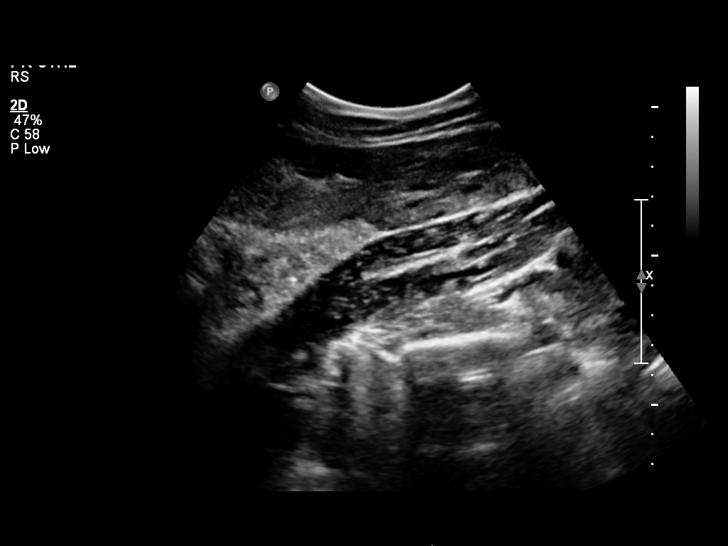
[im 90/94]
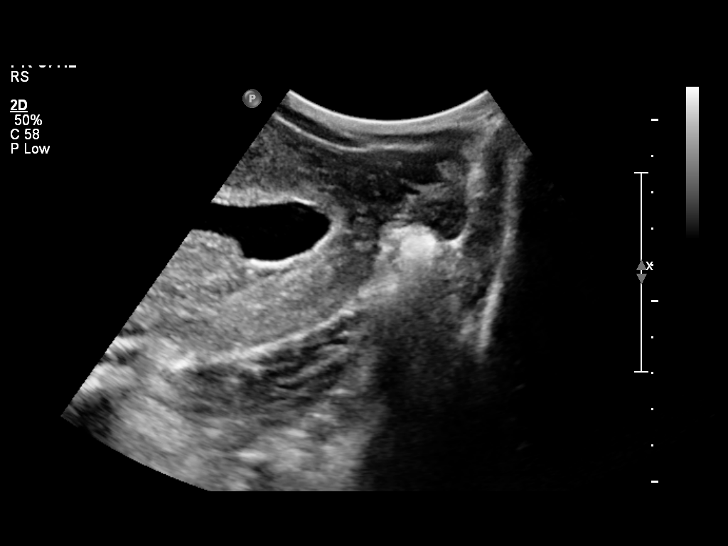

[12 of 28 positions shown; findings below may reference images not displayed]

OBSTETRICS REPORT
                    (Corrected Final 10/03/2011 [DATE])

                 CNM
 Order#:         [id number]
Procedures

 US OB DETAIL + 14 WK                                  76811.0
Indications

 Detailed fetal anatomic survey
Fetal Evaluation

 Cardiac Activity:  Observed
 Presentation:      Breech
 Placenta:          Anterior, above cervical os
 P. Cord            Marginal insertion
 Insertion:

 Comment:    *Posterior succenturiate lobe of placenta

 Amniotic Fluid
 AFI FV:      Subjectively within normal limits
                                             Larg Pckt:     5.2  cm
Biometry

 BPD:     47.7  mm    G. Age:   20w 3d                CI:        76.96   70 - 86
                                                      FL/HC:      18.7   16.8 -

 HC:     172.2  mm    G. Age:   19w 6d       47  %    HC/AC:      1.21   1.09 -

 AC:     142.1  mm    G. Age:   19w 4d       40  %    FL/BPD:
 FL:      32.2  mm    G. Age:   20w 0d       54  %    FL/AC:      22.7   20 - 24
 HUM:     30.2  mm    G. Age:   20w 0d       59  %
 NFT:     3.79  mm

 Est. FW:     314  gm    0 lb 11 oz      50  %
Gestational Age

 LMP:           19w 5d       Date:   05/15/11                 EDD:   02/19/12
 U/S Today:     20w 0d                                        EDD:   02/17/12
 Best:          19w 5d    Det. By:   LMP  (05/15/11)          EDD:   02/19/12
Anatomy
 Cranium:           Appears normal      Aortic Arch:       Appears normal
 Fetal Cavum:       Appears normal      Ductal Arch:       Appears normal
 Ventricles:        Appears normal      Diaphragm:         Appears normal
 Choroid Plexus:    Appears normal      Stomach:           Appears normal
 Cerebellum:        Appears normal      Abdomen:           Appears normal
 Posterior Fossa:   Appears normal      Abdominal Wall:    Appears nml
                                                           (cord insert,
                                                           abd wall)
 Nuchal Fold:       Appears normal      Cord Vessels:      Appears normal
                    (neck, nuchal                          (3 vessel cord)
                    fold)
 Face:              Appears normal      Kidneys:           Appear normal
                    (lips/profile/orbit
                    s)
 Heart:             Appears normal      Bladder:           Appears normal
                    (4 chamber &
                    axis)
 RVOT:              Appears normal      Spine:             Appears normal
 LVOT:              Appears normal      Limbs:             Appears normal
                                                           (hands, ankles,
                                                           feet)

 Other:     Female gender. Heels and 5th digit visualized. Nasal
            bone visualized.
Cervix Uterus Adnexa

 Cervical Length:   3.51      cm

 Cervix:       Normal appearance by transabdominal scan.
 Left Ovary:   Within normal limits.
 Right Ovary:  Not visualized.

 Adnexa:     No abnormality visualized.
Impression

 Single live IUP in breech presentation.   Concordant
 measurements/assigned GA by LMP.
 No anatomic abnormality seen with a good quality survey
 possible.
 Marginal placental cord insertion with anterior
 placenta/posterior succenturiate lobe.

 called by Bouganmi Ardaoui requesting additional information
 regarding the succenturiate placental lobe:   There is no vasa
 previa. The communicating vessels between the main
 placenta and succenturiate lobe are at the uterine fundus.

 questions or concerns.

## 2013-12-09 ENCOUNTER — Encounter: Payer: Self-pay | Admitting: Obstetrics & Gynecology

## 2014-01-13 ENCOUNTER — Other Ambulatory Visit (INDEPENDENT_AMBULATORY_CARE_PROVIDER_SITE_OTHER): Payer: BC Managed Care – PPO

## 2014-01-13 DIAGNOSIS — L682 Localized hypertrichosis: Secondary | ICD-10-CM

## 2014-01-13 DIAGNOSIS — L678 Other hair color and hair shaft abnormalities: Secondary | ICD-10-CM

## 2014-01-13 DIAGNOSIS — R7989 Other specified abnormal findings of blood chemistry: Secondary | ICD-10-CM

## 2014-01-14 LAB — TESTOSTERONE, FREE, TOTAL, SHBG
Sex Hormone Binding: 52 nmol/L (ref 18–114)
TESTOSTERONE FREE: 5.2 pg/mL (ref 0.6–6.8)
TESTOSTERONE-% FREE: 1.3 % (ref 0.4–2.4)
TESTOSTERONE: 39 ng/dL (ref 10–70)

## 2014-01-16 ENCOUNTER — Other Ambulatory Visit: Payer: 59

## 2014-02-07 ENCOUNTER — Encounter (HOSPITAL_COMMUNITY): Payer: Self-pay | Admitting: *Deleted

## 2014-02-07 ENCOUNTER — Emergency Department (HOSPITAL_COMMUNITY)
Admission: EM | Admit: 2014-02-07 | Discharge: 2014-02-07 | Disposition: A | Discharge status: Home / Self Care | Payer: BC Managed Care – PPO | Attending: Emergency Medicine | Admitting: Emergency Medicine

## 2014-02-07 ENCOUNTER — Emergency Department (HOSPITAL_COMMUNITY): Payer: BC Managed Care – PPO

## 2014-02-07 DIAGNOSIS — R079 Chest pain, unspecified: Secondary | ICD-10-CM | POA: Insufficient documentation

## 2014-02-07 DIAGNOSIS — Z8679 Personal history of other diseases of the circulatory system: Secondary | ICD-10-CM | POA: Insufficient documentation

## 2014-02-07 DIAGNOSIS — R000 Tachycardia, unspecified: Secondary | ICD-10-CM | POA: Insufficient documentation

## 2014-02-07 DIAGNOSIS — R002 Palpitations: Secondary | ICD-10-CM

## 2014-02-07 DIAGNOSIS — F419 Anxiety disorder, unspecified: Secondary | ICD-10-CM | POA: Insufficient documentation

## 2014-02-07 DIAGNOSIS — Z3202 Encounter for pregnancy test, result negative: Secondary | ICD-10-CM | POA: Diagnosis not present

## 2014-02-07 DIAGNOSIS — Z91040 Latex allergy status: Secondary | ICD-10-CM | POA: Insufficient documentation

## 2014-02-07 DIAGNOSIS — R200 Anesthesia of skin: Secondary | ICD-10-CM | POA: Diagnosis not present

## 2014-02-07 DIAGNOSIS — Z880 Allergy status to penicillin: Secondary | ICD-10-CM | POA: Insufficient documentation

## 2014-02-07 HISTORY — DX: Cardiac arrhythmia, unspecified: I49.9

## 2014-02-07 LAB — URINALYSIS, ROUTINE W REFLEX MICROSCOPIC
Bilirubin Urine: NEGATIVE
GLUCOSE, UA: NEGATIVE mg/dL
HGB URINE DIPSTICK: NEGATIVE
KETONES UR: NEGATIVE mg/dL
LEUKOCYTES UA: NEGATIVE
Nitrite: NEGATIVE
PH: 7 (ref 5.0–8.0)
Protein, ur: NEGATIVE mg/dL
Specific Gravity, Urine: 1.015 (ref 1.005–1.030)
Urobilinogen, UA: 0.2 mg/dL (ref 0.0–1.0)

## 2014-02-07 LAB — CBC WITH DIFFERENTIAL/PLATELET
Basophils Absolute: 0 10*3/uL (ref 0.0–0.1)
Basophils Relative: 0 % (ref 0–1)
EOS PCT: 1 % (ref 0–5)
Eosinophils Absolute: 0.1 10*3/uL (ref 0.0–0.7)
HEMATOCRIT: 35.2 % — AB (ref 36.0–46.0)
HEMOGLOBIN: 12 g/dL (ref 12.0–15.0)
LYMPHS ABS: 2.1 10*3/uL (ref 0.7–4.0)
LYMPHS PCT: 27 % (ref 12–46)
MCH: 31.7 pg (ref 26.0–34.0)
MCHC: 34.1 g/dL (ref 30.0–36.0)
MCV: 92.9 fL (ref 78.0–100.0)
MONO ABS: 0.5 10*3/uL (ref 0.1–1.0)
MONOS PCT: 6 % (ref 3–12)
Neutro Abs: 5.3 10*3/uL (ref 1.7–7.7)
Neutrophils Relative %: 66 % (ref 43–77)
Platelets: 268 10*3/uL (ref 150–400)
RBC: 3.79 MIL/uL — AB (ref 3.87–5.11)
RDW: 12.1 % (ref 11.5–15.5)
WBC: 8 10*3/uL (ref 4.0–10.5)

## 2014-02-07 LAB — TSH: TSH: 0.786 u[IU]/mL (ref 0.350–4.500)

## 2014-02-07 LAB — COMPREHENSIVE METABOLIC PANEL
ALBUMIN: 4.3 g/dL (ref 3.5–5.2)
ALT: 11 U/L (ref 0–35)
AST: 18 U/L (ref 0–37)
Alkaline Phosphatase: 58 U/L (ref 39–117)
Anion gap: 11 (ref 5–15)
BUN: 8 mg/dL (ref 6–23)
CALCIUM: 9.5 mg/dL (ref 8.4–10.5)
CO2: 19 mmol/L (ref 19–32)
CREATININE: 0.77 mg/dL (ref 0.50–1.10)
Chloride: 105 mEq/L (ref 96–112)
GFR calc Af Amer: >90 mL/min (ref >90)
GFR calc non Af Amer: >90 mL/min (ref >90)
Glucose, Bld: 121 mg/dL — ABNORMAL HIGH (ref 70–99)
Potassium: 3.8 mmol/L (ref 3.5–5.1)
SODIUM: 135 mmol/L (ref 135–145)
Total Bilirubin: 1.2 mg/dL (ref 0.3–1.2)
Total Protein: 7.8 g/dL (ref 6.0–8.3)

## 2014-02-07 LAB — D-DIMER, QUANTITATIVE (NOT AT ARMC): D-Dimer, Quant: <0.27 ug/mL-FEU (ref 0.00–0.48)

## 2014-02-07 LAB — PREGNANCY, URINE: Preg Test, Ur: NEGATIVE

## 2014-02-07 LAB — TROPONIN I

## 2014-02-07 MED ORDER — SODIUM CHLORIDE 0.9 % IV BOLUS (SEPSIS)
500.0000 mL | Freq: Once | INTRAVENOUS | Status: AC
  Administered 2014-02-07: 500 mL via INTRAVENOUS

## 2014-02-07 MED ORDER — LORAZEPAM 0.5 MG PO TABS: 0.5000 mg | ORAL_TABLET | Freq: Three times a day (TID) | ORAL | Status: DC | PRN

## 2014-02-07 MED ORDER — LORAZEPAM 2 MG/ML IJ SOLN
0.5000 mg | Freq: Once | INTRAMUSCULAR | Status: AC
  Administered 2014-02-07: 0.5 mg via INTRAVENOUS
  Filled 2014-02-07: qty 1

## 2014-02-07 MED ORDER — LORAZEPAM 2 MG/ML IJ SOLN
0.5000 mg | Freq: Once | INTRAMUSCULAR | Status: DC
Start: 2014-02-07 — End: 2014-02-07

## 2014-02-07 NOTE — Discharge Instructions (Signed)
As discussed, your evaluation today has been largely reassuring.  But, it is important that you monitor your condition carefully, and do not hesitate to return to the ED if you develop new, or concerning changes in your condition. ? ?Otherwise, please follow-up with your physician for appropriate ongoing care. ? ?

## 2014-02-07 NOTE — ED Notes (Signed)
Pt has been having "palpitations" for a couple of moths and this pm she was coming hom from church when she began feeling like she was dizzy, like she was going to pass out, pt anxious in triage.

## 2014-02-07 NOTE — ED Notes (Signed)
MD at bedside. 

## 2014-02-07 NOTE — ED Notes (Signed)
Duplicate order discontinued.  

## 2014-02-07 NOTE — ED Provider Notes (Signed)
CSN: [id number]     Arrival date & time 02/07/14  0134 History  This chart was scribed for Carmin Muskrat, MD by Martinique Peace, ED Scribe. The patient was seen in D31C/D31C. The patient's care was started at 2:04 AM.     Chief Complaint  Patient presents with  . Palpitations      Patient is a 29 y.o. female presenting with palpitations. The history is provided by the patient. No language interpreter was used.  Palpitations Associated symptoms: chest pain   Associated symptoms: no vomiting     HPI Comments: Haley [name] is a 29 y.o. female who presents to the Emergency Department complaining of intermitttent palpitations over past 4 or 5 months. Pt states she is feeling "woozy" and lightheaded. She notes episodes tend to happen at night whenever she is getting ready to go to sleep. She states that heartbeat feels irregular but not necessarily faster. She reports fluctuation with weight, right sided facial numbness, and chest pain. Pt has followed up with PCP about issues and has had tests performed. Pt adds that she has raised testosterone levels that were found after her pregnancy. No complaints of vomiting or diarrhea. Fast heart rate noted during evaluation. Pt is non-smoker.    Past Medical History  Diagnosis Date  . No pertinent past medical history   . Irregular heart beat    Past Surgical History  Procedure Laterality Date  . Wisdom tooth extraction    . Wisdom tooth extraction     Family History  Problem Relation Age of Onset  . Asthma Mother   . Diabetes Father    History  Substance Use Topics  . Smoking status: Never Smoker   . Smokeless tobacco: Never Used  . Alcohol Use: No   OB History    Gravida Para Term Preterm AB TAB SAB Ectopic Multiple Living   3 2 2  0 1 1 0 0 0 2     Review of Systems  Constitutional: Positive for unexpected weight change.       Per HPI, otherwise negative  HENT:       Right sided facial numbness. Per HPI, otherwise negative.    Respiratory:       Per HPI, otherwise negative  Cardiovascular: Positive for chest pain and palpitations.       Per HPI, otherwise negative  Gastrointestinal: Negative for vomiting and diarrhea.  Endocrine:       Negative aside from HPI  Genitourinary:       Neg aside from HPI   Musculoskeletal:       Per HPI, otherwise negative  Skin: Negative.   Neurological: Positive for light-headedness. Negative for syncope.      Allergies  Latex and Penicillins  Home Medications   Prior to Admission medications   Not on File   BP 153/92 mmHg  Pulse 110  Temp(Src) 98.1 F (36.7 C) (Oral)  Resp 17  Ht 5' (1.524 m)  Wt 108 lb (48.988 kg)  BMI 21.09 kg/m2  SpO2 100% Physical Exam  Constitutional: She is oriented to person, place, and time. She appears well-developed and well-nourished. No distress.  HENT:  Head: Normocephalic and atraumatic.  Eyes: Conjunctivae and EOM are normal.  Cardiovascular: Regular rhythm and normal heart sounds.  Tachycardia present.   Pulmonary/Chest: Effort normal and breath sounds normal. No stridor. No respiratory distress.  Abdominal: She exhibits no distension.  Musculoskeletal: She exhibits no edema.  Neurological: She is alert and oriented to person, place,  and time. No cranial nerve deficit.  Skin: Skin is warm and dry. No rash noted.  Psychiatric: Her mood appears anxious.  Nursing note and vitals reviewed.   ED Course  Procedures (including critical care time) Labs Review Labs Reviewed  COMPREHENSIVE METABOLIC PANEL  CBC WITH DIFFERENTIAL  URINALYSIS, ROUTINE W REFLEX MICROSCOPIC  PREGNANCY, URINE    Imaging Review No results found.   EKG Interpretation   Date/Time:  Friday February 07 2014 01:42:19 EST Ventricular Rate:  132 PR Interval:  130 QRS Duration: 72 QT Interval:  284 QTC Calculation: 420 R Axis:   72 Text Interpretation:  Sinus tachycardia Cannot rule out Anterior infarct ,  age undetermined Abnormal ECG Since  last tracing rate faster Confirmed by  OTTER  MD, OLGA ([id number]) on 02/07/2014 1:48:44 AM     Medications  sodium chloride 0.9 % bolus 500 mL (not administered)   2:09 AM- Treatment plan was discussed with patient who verbalizes understanding and agrees.  As discussed, it is important that you follow up as soon as possible with your physician for continued management of your condition.  5:44 AM Patient sleeping, awakens easily. I discussed the findings with her and her husband. Patient has been referred to cardiology by her primary care physician as well.   MDM   Patient percent palpitations. Patient's evaluation here is largely reassuring, with no findings consistent with ACS, PE, pneumonia, or other acute new pathology. With ongoing palpitations, patient was referred to cardiology for Holter monitoring. Given the patient's pending thyroid studies, as well as her history of elevated testosterone levels, I also encouraged her to follow up with endocrinology.   I personally performed the services described in this documentation, which was scribed in my presence. The recorded information has been reviewed and is accurate.   Carmin Muskrat, MD 02/07/14 [phone]

## 2014-02-07 NOTE — ED Notes (Signed)
Patient transported to X-ray 

## 2014-02-09 LAB — T4, FREE: Free T4: 1.27 ng/dL (ref 0.80–1.80)

## 2014-03-10 DIAGNOSIS — I493 Ventricular premature depolarization: Secondary | ICD-10-CM

## 2014-03-10 HISTORY — DX: Ventricular premature depolarization: I49.3

## 2014-11-26 ENCOUNTER — Ambulatory Visit: Payer: BLUE CROSS/BLUE SHIELD | Admitting: Advanced Practice Midwife

## 2014-12-01 ENCOUNTER — Encounter: Payer: Self-pay | Admitting: Family Medicine

## 2014-12-01 ENCOUNTER — Ambulatory Visit: Payer: BLUE CROSS/BLUE SHIELD | Admitting: Family Medicine

## 2014-12-01 ENCOUNTER — Ambulatory Visit (INDEPENDENT_AMBULATORY_CARE_PROVIDER_SITE_OTHER): Payer: 59 | Admitting: Family Medicine

## 2014-12-01 VITALS — BP 111/76 | HR 68 | Resp 18 | Ht 60.5 in | Wt 115.0 lb

## 2014-12-01 DIAGNOSIS — Z113 Encounter for screening for infections with a predominantly sexual mode of transmission: Secondary | ICD-10-CM

## 2014-12-01 DIAGNOSIS — N898 Other specified noninflammatory disorders of vagina: Secondary | ICD-10-CM

## 2014-12-01 LAB — RPR

## 2014-12-01 NOTE — Patient Instructions (Signed)

## 2014-12-01 NOTE — Progress Notes (Signed)
    Subjective:    Patient ID: Haley [name] is a 29 y.o. female presenting with Vaginal Itching  on 12/01/2014  HPI: Had some vaginal irritation and itching. Mostly external. Took some OTC yeast medicine. Patient has learned her husband has cheated and she would like full STD testing.  Review of Systems  Constitutional: Negative for fever and chills.  Respiratory: Negative for shortness of breath.   Cardiovascular: Negative for chest pain.  Gastrointestinal: Negative for nausea, vomiting and abdominal pain.  Genitourinary: Negative for dysuria.  Skin: Negative for rash.      Objective:    BP 111/76 mmHg  Pulse 68  Resp 18  Ht 5' 0.5" (1.537 m)  Wt 115 lb (52.164 kg)  BMI 22.08 kg/m2  LMP 11/10/2014 Physical Exam  Constitutional: She is oriented to person, place, and time. She appears well-developed and well-nourished. No distress.  HENT:  Head: Normocephalic and atraumatic.  Eyes: No scleral icterus.  Neck: Neck supple.  Cardiovascular: Normal rate.   Pulmonary/Chest: Effort normal.  Abdominal: Soft.  Neurological: She is alert and oriented to person, place, and time.  Skin: Skin is warm and dry.  Psychiatric: She has a normal mood and affect.      Assessment & Plan:   Problem List Items Addressed This Visit    None    Visit Diagnoses    Screen for STD (sexually transmitted disease)    -  Primary    Relevant Orders    Wet prep, genital    RPR    HIV antibody    Hepatitis B surface antigen    Hepatitis C antibody    Vaginal irritation        Relevant Orders    Wet prep, genital    GC/Chlamydia probe amp ([place])not at [facility]       Return if symptoms worsen or fail to improve.  Virdia Ziesmer S 12/01/2014 1:50 PM

## 2014-12-02 ENCOUNTER — Telehealth: Payer: Self-pay | Admitting: *Deleted

## 2014-12-02 DIAGNOSIS — N898 Other specified noninflammatory disorders of vagina: Secondary | ICD-10-CM

## 2014-12-02 LAB — WET PREP, GENITAL
Trich, Wet Prep: NONE SEEN
Yeast Wet Prep HPF POC: NONE SEEN

## 2014-12-02 LAB — HEPATITIS B SURFACE ANTIGEN: Hepatitis B Surface Ag: NEGATIVE

## 2014-12-02 LAB — GC/CHLAMYDIA PROBE AMP (~~LOC~~) NOT AT ARMC
Chlamydia: NEGATIVE
NEISSERIA GONORRHEA: NEGATIVE

## 2014-12-02 LAB — HEPATITIS C ANTIBODY: HCV Ab: NEGATIVE

## 2014-12-02 LAB — HIV ANTIBODY (ROUTINE TESTING W REFLEX): HIV 1&2 Ab, 4th Generation: NONREACTIVE

## 2014-12-02 MED ORDER — METRONIDAZOLE 500 MG PO TABS: 500.0000 mg | ORAL_TABLET | Freq: Two times a day (BID) | ORAL | Status: DC

## 2014-12-02 NOTE — Telephone Encounter (Signed)
-----   Message from Donnamae Jude, MD sent at 12/02/2014  8:20 AM EDT ----- Please call in Flagyl 500 mg bid x 7 d # 14 no RF

## 2014-12-02 NOTE — Telephone Encounter (Signed)
Informed pt of results and sent rx to pharmacy.  Instructed on use and ways to prevent BV.  Pt acknowledged.

## 2015-01-05 ENCOUNTER — Encounter: Payer: Self-pay | Admitting: Obstetrics & Gynecology

## 2015-01-05 ENCOUNTER — Ambulatory Visit (INDEPENDENT_AMBULATORY_CARE_PROVIDER_SITE_OTHER): Payer: 59 | Admitting: Obstetrics & Gynecology

## 2015-01-05 VITALS — BP 116/77 | HR 64 | Resp 18 | Ht 60.5 in | Wt 118.0 lb

## 2015-01-05 DIAGNOSIS — N644 Mastodynia: Secondary | ICD-10-CM

## 2015-01-05 DIAGNOSIS — N63 Unspecified lump in breast: Secondary | ICD-10-CM

## 2015-01-05 DIAGNOSIS — N6310 Unspecified lump in the right breast, unspecified quadrant: Secondary | ICD-10-CM

## 2015-01-05 NOTE — Patient Instructions (Signed)
Return to clinic for any scheduled appointments or for any gynecologic concerns as needed.   

## 2015-01-05 NOTE — Progress Notes (Signed)
   CLINIC ENCOUNTER NOTE  History:  29 y.o. [id number] here today for evaluation of painful breast lump she noticed in her right breast last week. Mass is round, mobile, tender to touch, and is on outer part of right breast.  No associated nipple discharge. No pain radiation, no abnormal vessels. No FH of breast or ovarian cancer. She denies any abnormal vaginal discharge, bleeding, pelvic pain or other concerns.   Past Medical History  Diagnosis Date  . Irregular heart beat   . Premature ventricular contractions 03/2014    Past Surgical History  Procedure Laterality Date  . Wisdom tooth extraction      The following portions of the patient's history were reviewed and updated as appropriate: allergies, current medications, past family history, past medical history, past social history, past surgical history and problem list.   Health Maintenance:  Normal pap and negative GC and Chlam on 06/17/13.    Review of Systems:  Pertinent items noted in HPI and remainder of comprehensive ROS otherwise negative.  Objective:  Physical Exam BP 116/77 mmHg  Pulse 64  Resp 18  Ht 5' 0.5" (1.537 m)  Wt 118 lb (53.524 kg)  BMI 22.66 kg/m2  LMP 12/07/2014 CONSTITUTIONAL: Well-developed, well-nourished female in no acute distress.  [place]: Alert and oriented to person, place, and time. Normal reflexes, muscle tone coordination. No cranial nerve deficit noted. CARDIOVASCULAR: Normal heart rate noted, regular rhythm RESPIRATORY: Clear to auscultation bilaterally. Effort and breath sounds normal, no problems with respiration noted. BREASTS: Symmetric in size. 3 cm hardened area noted on outer right breast about 3 cm lateral from nipple, mobile, tender. No other masses, skin changes, nipple drainage, or lymphadenopathy bilaterally. ABDOMEN: Soft, normal bowel sounds, no distention noted.   PELVIC:Deferred MUSCULOSKELETAL: Normal range of motion.  No cyanosis, clubbing, or edema.  2+ distal  pulses.   Assessment & Plan:  1. Mass of right breast 2. Breast pain, right Discussed etiologies of breast lumps and pain in detail.  Reassured patient that this was likely fibrocystic breast disease or fibroadenoma but will follow up with imaging - MM DIAG BREAST TOMO UNI RIGHT; Future - US BREAST LTD UNI RIGHT INC AXILLA; Future  Routine preventative health maintenance measures emphasized. Please refer to After Visit Summary for other counseling recommendations.   Total face-to-face time with patient: 25 minutes. Over 50% of encounter was spent on counseling and coordination of care.   Verita Schneiders, MD, [place] Attending Obstetrician & Gynecologist, [place] [facility]

## 2015-01-09 ENCOUNTER — Ambulatory Visit
Admission: RE | Admit: 2015-01-09 | Discharge: 2015-01-09 | Disposition: A | Discharge status: Home / Self Care | Payer: 59 | Source: Ambulatory Visit | Attending: Obstetrics & Gynecology | Admitting: Obstetrics & Gynecology

## 2015-01-09 DIAGNOSIS — N644 Mastodynia: Secondary | ICD-10-CM

## 2015-01-09 DIAGNOSIS — N6310 Unspecified lump in the right breast, unspecified quadrant: Secondary | ICD-10-CM

## 2015-01-12 ENCOUNTER — Other Ambulatory Visit: Payer: 59

## 2015-01-31 ENCOUNTER — Emergency Department (HOSPITAL_COMMUNITY)
Admission: EM | Admit: 2015-01-31 | Discharge: 2015-01-31 | Discharge status: Against Medical Advice | Payer: 59 | Attending: Emergency Medicine | Admitting: Emergency Medicine

## 2015-01-31 ENCOUNTER — Encounter (HOSPITAL_COMMUNITY): Payer: Self-pay

## 2015-01-31 ENCOUNTER — Emergency Department (INDEPENDENT_AMBULATORY_CARE_PROVIDER_SITE_OTHER)
Admission: EM | Admit: 2015-01-31 | Discharge: 2015-01-31 | Disposition: A | Discharge status: Home / Self Care | Payer: 59 | Source: Home / Self Care | Attending: Emergency Medicine | Admitting: Emergency Medicine

## 2015-01-31 ENCOUNTER — Encounter (HOSPITAL_COMMUNITY): Payer: Self-pay | Admitting: Emergency Medicine

## 2015-01-31 DIAGNOSIS — R197 Diarrhea, unspecified: Secondary | ICD-10-CM | POA: Diagnosis not present

## 2015-01-31 DIAGNOSIS — R109 Unspecified abdominal pain: Secondary | ICD-10-CM | POA: Diagnosis not present

## 2015-01-31 DIAGNOSIS — R112 Nausea with vomiting, unspecified: Secondary | ICD-10-CM

## 2015-01-31 DIAGNOSIS — R1110 Vomiting, unspecified: Secondary | ICD-10-CM | POA: Insufficient documentation

## 2015-01-31 DIAGNOSIS — K529 Noninfective gastroenteritis and colitis, unspecified: Secondary | ICD-10-CM

## 2015-01-31 LAB — URINALYSIS, ROUTINE W REFLEX MICROSCOPIC
Bilirubin Urine: NEGATIVE
GLUCOSE, UA: NEGATIVE mg/dL
Ketones, ur: 15 mg/dL — AB
LEUKOCYTES UA: NEGATIVE
Nitrite: NEGATIVE
PH: 5 (ref 5.0–8.0)
PROTEIN: NEGATIVE mg/dL
Specific Gravity, Urine: 1.029 (ref 1.005–1.030)

## 2015-01-31 LAB — URINE MICROSCOPIC-ADD ON
BACTERIA UA: NONE SEEN
RBC / HPF: NONE SEEN RBC/hpf (ref 0–5)
WBC UA: NONE SEEN WBC/hpf (ref 0–5)

## 2015-01-31 LAB — COMPREHENSIVE METABOLIC PANEL
ALT: 12 U/L — AB (ref 14–54)
AST: 21 U/L (ref 15–41)
Albumin: 4.4 g/dL (ref 3.5–5.0)
Alkaline Phosphatase: 61 U/L (ref 38–126)
Anion gap: 8 (ref 5–15)
BUN: 9 mg/dL (ref 6–20)
CHLORIDE: 108 mmol/L (ref 101–111)
CO2: 22 mmol/L (ref 22–32)
CREATININE: 0.79 mg/dL (ref 0.44–1.00)
Calcium: 9.9 mg/dL (ref 8.9–10.3)
GFR calc Af Amer: >60 mL/min (ref >60)
Glucose, Bld: 106 mg/dL — ABNORMAL HIGH (ref 65–99)
POTASSIUM: 4.3 mmol/L (ref 3.5–5.1)
SODIUM: 138 mmol/L (ref 135–145)
Total Bilirubin: 1.2 mg/dL (ref 0.3–1.2)
Total Protein: 8 g/dL (ref 6.5–8.1)

## 2015-01-31 LAB — CBC
HEMATOCRIT: 38.5 % (ref 36.0–46.0)
Hemoglobin: 13 g/dL (ref 12.0–15.0)
MCH: 31.8 pg (ref 26.0–34.0)
MCHC: 33.8 g/dL (ref 30.0–36.0)
MCV: 94.1 fL (ref 78.0–100.0)
PLATELETS: 278 10*3/uL (ref 150–400)
RBC: 4.09 MIL/uL (ref 3.87–5.11)
RDW: 12.1 % (ref 11.5–15.5)
WBC: 13.9 10*3/uL — AB (ref 4.0–10.5)

## 2015-01-31 LAB — I-STAT BETA HCG BLOOD, ED (MC, WL, AP ONLY): I-stat hCG, quantitative: <5 m[IU]/mL (ref <5)

## 2015-01-31 LAB — LIPASE, BLOOD: LIPASE: 18 U/L (ref 11–51)

## 2015-01-31 MED ORDER — ONDANSETRON 4 MG PO TBDP
ORAL_TABLET | ORAL | Status: AC
  Filled 2015-01-31: qty 1

## 2015-01-31 MED ORDER — ONDANSETRON 4 MG PO TBDP: 4.0000 mg | ORAL_TABLET | Freq: Three times a day (TID) | ORAL | Status: DC | PRN

## 2015-01-31 MED ORDER — ONDANSETRON 4 MG PO TBDP
4.0000 mg | ORAL_TABLET | Freq: Once | ORAL | Status: AC | PRN
  Administered 2015-01-31: 4 mg via ORAL

## 2015-01-31 NOTE — ED Notes (Signed)
Pt came to nurse first to advise she has called her PCP.  PCP is going to call pt something in for nausea.  Advised to come back to ED if symptoms persist or worsens.

## 2015-01-31 NOTE — ED Notes (Signed)
Pt c/o abdominal pain with vomiting and diarrhea onset today.

## 2015-01-31 NOTE — ED Provider Notes (Signed)
HPI  SUBJECTIVE:  Haley [name] is a 29 y.o. female who presents with "food poisoning" after eating at [facility]. States the symptoms started approximately 6 hours later. She reports having multiple episodes of nonbilious, nonbloody emesis and watery, nonbloody diarrhea. Estimates 10-15 episodes of emesis, over 20 episodes of diarrhea. She reports crampy abdominal pain before vomiting or having a bowel movement, which resolves afterwards. No aggravating factors. She was given Zofran in the ED which she states has improved her symptoms. She denies fevers, abdominal distention, states that she has urinated approximately 5 or 6 times today, urinary complaints. No back pain. She states that she feels lightheaded, but no presyncope, syncope. No alcohol, NSAID use. Past medical history of benign PVCs. No history of  diabetes, hypertension, abdominal surgeries. Patient presented to the ED prior to the urgent care presentation, had labs and was given Zofran. She was left without being seen by a provider.  Past Medical History  Diagnosis Date  . Irregular heart beat   . Premature ventricular contractions 03/2014    Past Surgical History  Procedure Laterality Date  . Wisdom tooth extraction      Family History  Problem Relation Age of Onset  . Asthma Mother   . Diabetes Father     Social History  Substance Use Topics  . Smoking status: Never Smoker   . Smokeless tobacco: Never Used  . Alcohol Use: No    No current facility-administered medications for this encounter.  Current outpatient prescriptions:  .  Cholecalciferol (VITAMIN D-3 PO), Take by mouth daily., Disp: , Rfl:  .  Fish Oil-Cholecalciferol (FISH OIL + D3 PO), Take by mouth., Disp: , Rfl:  .  Multiple Vitamins-Minerals (MULTIVITAMIN ADULT PO), Take by mouth., Disp: , Rfl:  .  ondansetron (ZOFRAN ODT) 4 MG disintegrating tablet, Take 1 tablet (4 mg total) by mouth every 8 (eight) hours as needed for nausea or vomiting., Disp: 20  tablet, Rfl: 0  Allergies  Allergen Reactions  . Latex Hives  . Penicillins Hives     ROS  As noted in HPI.   Physical Exam  BP 128/82 mmHg  Pulse 95  Temp(Src) 98.7 F (37.1 C) (Oral)  Resp 20  SpO2 100%  LMP 01/02/2015   Lying, 127/82 heart rate 80s. Sitting 108/76, heart rate in 80s, standing 122/88 heart rate 80s,   Constitutional: Well developed, well nourished, no acute distress Eyes: PERRL, EOMI, conjunctiva normal bilaterally HENT: Normocephalic, atraumatic,mucus membranes moist Respiratory: Clear to auscultation bilaterally, no rales, no wheezing, no rhonchi Cardiovascular: Normal rate and rhythm, no murmurs, no gallops, no rubs GI: Normal appearance Soft, nondistended, normal bowel sounds, nontender, no rebound, no guarding Back: no CVAT skin: No rash, skin intact Musculoskeletal: no deformities Neurologic: Alert & oriented x 3, CN II-XII grossly intact, no motor deficits, sensation grossly intact Psychiatric: Speech and behavior appropriate   ED Course   Medications - No data to display  Orders Placed This Encounter  Procedures  . Orthostatic vital signs    Standing Status: Standing     Number of Occurrences: 1     Standing Expiration Date:   . Offer Fluids    Standing Status: Standing     Number of Occurrences: 20     Standing Expiration Date:    Results for orders placed or performed during the hospital encounter of 01/31/15 (from the past 24 hour(s))  Lipase, blood     Status: None   Collection Time: 01/31/15  1:19 PM  Result Value Ref Range   Lipase 18 11 - 51 U/L  Comprehensive metabolic panel     Status: Abnormal   Collection Time: 01/31/15  1:19 PM  Result Value Ref Range   Sodium 138 135 - 145 mmol/L   Potassium 4.3 3.5 - 5.1 mmol/L   Chloride 108 101 - 111 mmol/L   CO2 22 22 - 32 mmol/L   Glucose, Bld 106 (H) 65 - 99 mg/dL   BUN 9 6 - 20 mg/dL   Creatinine, Ser 0.79 0.44 - 1.00 mg/dL   Calcium 9.9 8.9 - 10.3 mg/dL   Total  Protein 8.0 6.5 - 8.1 g/dL   Albumin 4.4 3.5 - 5.0 g/dL   AST 21 15 - 41 U/L   ALT 12 (L) 14 - 54 U/L   Alkaline Phosphatase 61 38 - 126 U/L   Total Bilirubin 1.2 0.3 - 1.2 mg/dL   GFR calc non Af Amer >60 >60 mL/min   GFR calc Af Amer >60 >60 mL/min   Anion gap 8 5 - 15  CBC     Status: Abnormal   Collection Time: 01/31/15  1:19 PM  Result Value Ref Range   WBC 13.9 (H) 4.0 - 10.5 K/uL   RBC 4.09 3.87 - 5.11 MIL/uL   Hemoglobin 13.0 12.0 - 15.0 g/dL   HCT 38.5 36.0 - 46.0 %   MCV 94.1 78.0 - 100.0 fL   MCH 31.8 26.0 - 34.0 pg   MCHC 33.8 30.0 - 36.0 g/dL   RDW 12.1 11.5 - 15.5 %   Platelets 278 150 - 400 K/uL  I-Stat beta hCG blood, ED ([facility], [facility], [facility] only)     Status: None   Collection Time: 01/31/15  1:25 PM  Result Value Ref Range   I-stat hCG, quantitative <5.0 <5 mIU/mL   Comment 3          Urinalysis, Routine w reflex microscopic (not at [facility])     Status: Abnormal   Collection Time: 01/31/15  2:00 PM  Result Value Ref Range   Color, Urine YELLOW YELLOW   APPearance TURBID (A) CLEAR   Specific Gravity, Urine 1.029 1.005 - 1.030   pH 5.0 5.0 - 8.0   Glucose, UA NEGATIVE NEGATIVE mg/dL   Hgb urine dipstick TRACE (A) NEGATIVE   Bilirubin Urine NEGATIVE NEGATIVE   Ketones, ur 15 (A) NEGATIVE mg/dL   Protein, ur NEGATIVE NEGATIVE mg/dL   Nitrite NEGATIVE NEGATIVE   Leukocytes, UA NEGATIVE NEGATIVE  Urine microscopic-add on     Status: Abnormal   Collection Time: 01/31/15  2:00 PM  Result Value Ref Range   Squamous Epithelial / LPF 0-5 (A) NONE SEEN   WBC, UA NONE SEEN 0 - 5 WBC/hpf   RBC / HPF NONE SEEN 0 - 5 RBC/hpf   Bacteria, UA NONE SEEN NONE SEEN   Urine-Other AMORPHOUS URATES/PHOSPHATES    No results found.  ED Clinical Impression  Nausea vomiting and diarrhea  Gastroenteritis   ED Assessment/Plan  Labs from the ER reviewed. Lipase negative. mild leukocytosis, LFTs within normal limits, CMP normal. Urine negative for UTI, trace hemoglobin. She is not  pregnant.  Patient was given Zofran in the ED. She is tolerating by mouth fluids prior to discharge from here. Her abdomen is soft, benign. No evidence of surgical emergency. No evidence of severe dehydration. She was mildly orthostatic, but do not feel that the patient requires IV fluids at this time. Feel that she can do oral  rehydration.  Discussed labs, imaging, MDM, plan and followup with patient . Discussed sn/sx that should prompt return to the UC or ED. Patient  agrees with plan.   *This clinic note was created using Dragon dictation software. Therefore, there may be occasional mistakes despite careful proofreading.  ?   Melynda Ripple, MD 01/31/15 1620

## 2015-01-31 NOTE — ED Notes (Signed)
Pt has has vomiting and diarrhea since last night,went to ED and was given zofran this morning. Pt also having stomach cramps Pt alert and oriented.

## 2016-09-02 ENCOUNTER — Ambulatory Visit: Payer: 59 | Admitting: Certified Nurse Midwife

## 2016-09-23 ENCOUNTER — Ambulatory Visit (INDEPENDENT_AMBULATORY_CARE_PROVIDER_SITE_OTHER): Payer: 59 | Admitting: Advanced Practice Midwife

## 2016-09-23 ENCOUNTER — Encounter: Payer: Self-pay | Admitting: Advanced Practice Midwife

## 2016-09-23 VITALS — BP 106/83 | HR 77 | Resp 16 | Ht 60.5 in | Wt 111.0 lb

## 2016-09-23 DIAGNOSIS — Z01419 Encounter for gynecological examination (general) (routine) without abnormal findings: Secondary | ICD-10-CM

## 2016-09-23 DIAGNOSIS — Z113 Encounter for screening for infections with a predominantly sexual mode of transmission: Secondary | ICD-10-CM | POA: Diagnosis not present

## 2016-09-23 DIAGNOSIS — Z1389 Encounter for screening for other disorder: Secondary | ICD-10-CM

## 2016-09-23 DIAGNOSIS — N644 Mastodynia: Secondary | ICD-10-CM

## 2016-09-23 DIAGNOSIS — N6310 Unspecified lump in the right breast, unspecified quadrant: Secondary | ICD-10-CM

## 2016-09-23 DIAGNOSIS — Z124 Encounter for screening for malignant neoplasm of cervix: Secondary | ICD-10-CM

## 2016-09-23 NOTE — Patient Instructions (Signed)

## 2016-09-23 NOTE — Progress Notes (Signed)
   Subjective:    Patient ID: Haley [name], female    DOB: 1985-06-25, 31 y.o.   MRN: [id number]  HPI: Here for concern about increasingly painful mass in outer right breast. Also due for Pap. Last Pap 2015 Nml. Was seen11/2016 for breast mass. Had breast US showing fibrocystic breast tissue. Pt reports cyclic pain that has become severe since March 2018. Is too painful to even touch in the few days before her period.   Cycle: Q 28 days Flow: Moderate Dysmenorrhea: Mild   Requesting GC/Chlmaydia testing, wet prep. Not currently sexually active. Does not want BC.   Review of Systems  Constitutional: Negative for chills and fever.  Gastrointestinal: Negative for abdominal pain.  Genitourinary: Negative for menstrual problem, vaginal bleeding, vaginal discharge and vaginal pain.  Skin:       Denies erythema, swelling or drainage at site of breast mass. Pos tenderness.   Hematological: Negative for adenopathy.       Objective: BP 106/83   Pulse 77   Resp 16   Ht 5' 0.5" (1.537 m)   Wt 111 lb (50.3 kg)   LMP 09/01/2016   BMI 21.32 kg/m     Physical Exam  Constitutional: She is oriented to person, place, and time. She appears well-developed and well-nourished. No distress.  Eyes: No scleral icterus.  Cardiovascular: Normal rate and regular rhythm.   Pulmonary/Chest: Effort normal and breath sounds normal. No respiratory distress. Right breast exhibits mass (3x5 cm, tender, non-discrete, mobile mass at 9 o'clock. No erythema, fluctuance or warmth.) and tenderness. Right breast exhibits no inverted nipple, no nipple discharge and no skin change. Left breast exhibits no inverted nipple, no mass, no nipple discharge, no skin change and no tenderness. Breasts are symmetrical.  Abdominal: Soft. She exhibits no distension and no mass. There is no tenderness.  Genitourinary: Vagina normal and uterus normal. There is breast tenderness. No breast swelling, discharge or bleeding. There is no  lesion on the right labia. There is no lesion on the left labia. Uterus is not enlarged and not tender. Cervix exhibits no motion tenderness, no discharge and no friability. Right adnexum displays no mass and no tenderness. Left adnexum displays no mass and no tenderness. No erythema or bleeding in the vagina. No vaginal discharge found.  Musculoskeletal: Normal range of motion. She exhibits no edema.  Lymphadenopathy:       Right: No inguinal adenopathy present.       Left: No inguinal adenopathy present.  Neurological: She is alert and oriented to person, place, and time.  Skin: Skin is warm and dry.  Psychiatric: She has a normal mood and affect.  Nursing note and vitals reviewed.     Assessment & Plan:  1. Well woman exam  - Cytology - PAP  2. Breast mass, right--Likely still just fibrocystic breast tissue. Not sure why is has now become more painful. No evidence of infection, but will image due to significant change in Sx. Will likely need [place] also or instead.   - MM Digital Diagnostic Unilat R; Future  3. Mastalgia in female  - MM Digital Diagnostic Unilat R; Future  4. Encounter for Papanicolaou smear for cervical cancer screening  - Cytology - PAP  5. Screen for STD (sexually transmitted disease)  - Cytology - PAP  Tamala Julian, [place], [place] 09/24/2016

## 2016-09-26 LAB — CYTOLOGY - PAP
Bacterial vaginitis: NEGATIVE
Candida vaginitis: NEGATIVE
Chlamydia: NEGATIVE
DIAGNOSIS: NEGATIVE
Neisseria Gonorrhea: NEGATIVE
Trichomonas: NEGATIVE

## 2016-10-07 ENCOUNTER — Ambulatory Visit
Admission: RE | Admit: 2016-10-07 | Discharge: 2016-10-07 | Disposition: A | Discharge status: Home / Self Care | Payer: 59 | Source: Ambulatory Visit | Attending: Advanced Practice Midwife | Admitting: Advanced Practice Midwife

## 2016-10-07 DIAGNOSIS — N6310 Unspecified lump in the right breast, unspecified quadrant: Secondary | ICD-10-CM

## 2016-10-07 DIAGNOSIS — N644 Mastodynia: Secondary | ICD-10-CM

## 2016-10-11 ENCOUNTER — Encounter: Payer: Self-pay | Admitting: Advanced Practice Midwife

## 2016-10-11 DIAGNOSIS — N6011 Diffuse cystic mastopathy of right breast: Secondary | ICD-10-CM

## 2016-10-11 HISTORY — DX: Diffuse cystic mastopathy of right breast: N60.11

## 2016-12-16 ENCOUNTER — Other Ambulatory Visit (INDEPENDENT_AMBULATORY_CARE_PROVIDER_SITE_OTHER): Payer: 59 | Admitting: *Deleted

## 2016-12-16 DIAGNOSIS — N898 Other specified noninflammatory disorders of vagina: Secondary | ICD-10-CM | POA: Diagnosis not present

## 2016-12-16 DIAGNOSIS — Z113 Encounter for screening for infections with a predominantly sexual mode of transmission: Secondary | ICD-10-CM

## 2016-12-16 NOTE — Progress Notes (Addendum)
Patient presented to the office today for self swab. Patient reports having some itching discharge for a week now. Instructed patient on how to obtain a self swab. Swab labeled and sent to the lab. Patient informed we will call her once the results come back.Haley [name]

## 2016-12-19 LAB — CERVICOVAGINAL ANCILLARY ONLY
Bacterial vaginitis: POSITIVE — AB
CANDIDA VAGINITIS: POSITIVE — AB
Trichomonas: NEGATIVE

## 2016-12-20 ENCOUNTER — Other Ambulatory Visit: Payer: Self-pay

## 2016-12-20 MED ORDER — FLUCONAZOLE 150 MG PO TABS: 150.0000 mg | ORAL_TABLET | Freq: Once | ORAL | 0 refills | Status: AC

## 2016-12-20 MED ORDER — METRONIDAZOLE 500 MG PO TABS: 500.0000 mg | ORAL_TABLET | Freq: Two times a day (BID) | ORAL | 0 refills | Status: DC

## 2016-12-20 NOTE — Telephone Encounter (Signed)
Patient tested positive for yeast and BV. Will treat per protocol.

## 2016-12-26 ENCOUNTER — Ambulatory Visit: Payer: Self-pay | Admitting: Nurse Practitioner

## 2016-12-26 VITALS — BP 122/70 | HR 96 | Temp 98.5°F | Wt 113.2 lb

## 2016-12-26 DIAGNOSIS — N762 Acute vulvitis: Secondary | ICD-10-CM

## 2016-12-26 DIAGNOSIS — N390 Urinary tract infection, site not specified: Secondary | ICD-10-CM

## 2016-12-26 DIAGNOSIS — N760 Acute vaginitis: Secondary | ICD-10-CM

## 2016-12-26 DIAGNOSIS — R319 Hematuria, unspecified: Secondary | ICD-10-CM

## 2016-12-26 LAB — POCT URINALYSIS DIPSTICK
Bilirubin, UA: NEGATIVE
Glucose, UA: NEGATIVE
KETONES UA: NEGATIVE
Nitrite, UA: NEGATIVE
PH UA: 6.5 (ref 5.0–8.0)
PROTEIN UA: NEGATIVE
SPEC GRAV UA: 1.015 (ref 1.010–1.025)
UROBILINOGEN UA: NEGATIVE U/dL — AB

## 2016-12-26 MED ORDER — FLUCONAZOLE 150 MG PO TABS: 150.0000 mg | ORAL_TABLET | Freq: Every day | ORAL | 0 refills | Status: DC

## 2016-12-26 MED ORDER — SULFAMETHOXAZOLE-TRIMETHOPRIM 800-160 MG PO TABS: 1.0000 | ORAL_TABLET | Freq: Two times a day (BID) | ORAL | 0 refills | Status: AC

## 2016-12-26 NOTE — Patient Instructions (Addendum)
Vaginitis Vaginitis is a condition in which the vaginal tissue swells and becomes red (inflamed). This condition is most often caused by a change in the normal balance of bacteria and yeast that live in the vagina. This change causes an overgrowth of certain bacteria or yeast, which causes the inflammation. There are different types of vaginitis, but the most common types are:  Bacterial vaginosis.  Yeast infection (candidiasis).  Trichomoniasis vaginitis. This is a sexually transmitted disease (STD).  Viral vaginitis.  Atrophic vaginitis.  Allergic vaginitis.  What are the causes? The cause of this condition depends on the type of vaginitis. It can be caused by:  Bacteria (bacterial vaginosis).  Yeast, which is a fungus (yeast infection).  A parasite (trichomoniasis vaginitis).  A virus (viral vaginitis).  Low hormone levels (atrophic vaginitis). Low hormone levels can occur during pregnancy, breastfeeding, or after menopause.  Irritants, such as bubble baths, scented tampons, and feminine sprays (allergic vaginitis).  Other factors can change the normal balance of the yeast and bacteria that live in the vagina. These include:  Antibiotic medicines.  Poor hygiene.  Diaphragms, vaginal sponges, spermicides, birth control pills, and intrauterine devices (IUD).  Sex.  Infection.  Uncontrolled diabetes.  A weakened defense (immune) system.  What increases the risk? This condition is more likely to develop in women who:  Smoke.  Use vaginal douches, scented tampons, or scented sanitary pads.  Wear tight-fitting pants.  Wear thong underwear.  Use oral birth control pills or an IUD.  Have sex without a condom.  Have multiple sex partners.  Have an STD.  Frequently use the spermicide nonoxynol-9.  Eat lots of foods high in sugar.  Have uncontrolled diabetes.  Have low estrogen levels.  Have a weakened immune system from an immune disorder or medical  treatment.  Are pregnant or breastfeeding.  What are the signs or symptoms? Symptoms vary depending on the cause of the vaginitis. Common symptoms include:  Abnormal vaginal discharge. ? The discharge is white, gray, or yellow with bacterial vaginosis. ? The discharge is thick, white, and cheesy with a yeast infection. ? The discharge is frothy and yellow or greenish with trichomoniasis.  A bad vaginal smell. The smell is fishy with bacterial vaginosis.  Vaginal itching, pain, or swelling.  Sex that is painful.  Pain or burning when urinating.  Sometimes there are no symptoms. How is this diagnosed? This condition is diagnosed based on your symptoms and medical history. A physical exam, including a pelvic exam, will also be done. You may also have other tests, including:  Tests to determine the pH level (acidity or alkalinity) of your vagina.  A whiff test, to assess the odor that results when a sample of your vaginal discharge is mixed with a potassium hydroxide solution.  Tests of vaginal fluid. A sample will be examined under a microscope.  How is this treated? Treatment varies depending on the type of vaginitis you have. Your treatment may include:  Antibiotic creams or pills to treat bacterial vaginosis and trichomoniasis.  Antifungal medicines, such as vaginal creams or suppositories, to treat a yeast infection.  Medicine to ease discomfort if you have viral vaginitis. Your sexual partner should also be treated.  Estrogen delivered in a cream, pill, suppository, or vaginal ring to treat atrophic vaginitis. If vaginal dryness occurs, lubricants and moisturizing creams may help. You may need to avoid scented soaps, sprays, or douches.  Stopping use of a product that is causing allergic vaginitis. Then using a vaginal  cream to treat the symptoms.  Follow these instructions at home: Lifestyle  Keep your genital area clean and dry. Avoid soap, and only rinse the area  with water.  Do not douche or use tampons until your health care provider says it is okay to do so. Use sanitary pads, if needed.  Do not have sex until your health care provider approves. When you can return to sex, practice safe sex and use condoms.  Wipe from front to back. This avoids the spread of bacteria from the rectum to the vagina. General instructions  Take over-the-counter and prescription medicines only as told by your health care provider.  If you were prescribed an antibiotic medicine, take or use it as told by your health care provider. Do not stop taking or using the antibiotic even if you start to feel better.  Keep all follow-up visits as told by your health care provider. This is important. How is this prevented?  Use mild, non-scented products. Do not use things that can irritate the vagina, such as fabric softeners. Avoid the following products if they are scented: ? Feminine sprays. ? Detergents. ? Tampons. ? Feminine hygiene products. ? Soaps or bubble baths.  Let air reach your genital area. ? Wear cotton underwear to reduce moisture buildup. ? Avoid wearing underwear while you sleep. ? Avoid wearing tight pants and underwear or nylons without a cotton panel. ? Avoid wearing thong underwear.  Take off any wet clothing, such as bathing suits, as soon as possible.  Practice safe sex and use condoms. Contact a health care provider if:  You have abdominal pain.  You have a fever.  You have symptoms that last for more than 2-3 days. Get help right away if:  You have a fever and your symptoms suddenly get worse. Summary  Vaginitis is a condition in which the vaginal tissue becomes inflamed.This condition is most often caused by a change in the normal balance of bacteria and yeast that live in the vagina.  Treatment varies depending on the type of vaginitis you have.  Do not douche, use tampons , or have sex until your health care provider approves.  When you can return to sex, practice safe sex and use condoms. This information is not intended to replace advice given to you by your health care provider. Make sure you discuss any questions you have with your health care provider. Document Released: 11/21/2006 Document Revised: 03/01/2016 Document Reviewed: 03/01/2016 Elsevier Interactive Patient Education  2018 [employer].  Cellulitis, Adult Cellulitis is a skin infection. The infected area is usually red and tender. This condition occurs most often in the arms and lower legs. The infection can travel to the muscles, blood, and underlying tissue and become serious. It is very important to get treated for this condition. What are the causes? Cellulitis is caused by bacteria. The bacteria enter through a break in the skin, such as a cut, burn, insect bite, open sore, or crack. What increases the risk? This condition is more likely to occur in people who:  Have a weak defense system (immune system).  Have open wounds on the skin such as cuts, burns, bites, and scrapes. Bacteria can enter the body through these open wounds.  Are older.  Have diabetes.  Have a type of long-lasting (chronic) liver disease (cirrhosis) or kidney disease.  Use IV drugs.  What are the signs or symptoms? Symptoms of this condition include:  Redness, streaking, or spotting on the skin.  Swollen  area of the skin.  Tenderness or pain when an area of the skin is touched.  Warm skin.  Fever.  Chills.  Blisters.  How is this diagnosed? This condition is diagnosed based on a medical history and physical exam. You may also have tests, including:  Blood tests.  Lab tests.  Imaging tests.  How is this treated? Treatment for this condition may include:  Medicines, such as antibiotic medicines or antihistamines.  Supportive care, such as rest and application of cold or warm cloths (cold or warm compresses) to the skin.  Hospital care, if the  condition is severe.  The infection usually gets better within 1-2 days of treatment. Follow these instructions at home:  Take over-the-counter and prescription medicines only as told by your health care provider.  If you were prescribed an antibiotic medicine, take it as told by your health care provider. Do not stop taking the antibiotic even if you start to feel better.  Drink enough fluid to keep your urine clear or pale yellow.  Do not touch or rub the infected area.  Raise (elevate) the infected area above the level of your heart while you are sitting or lying down.  Apply warm or cold compresses to the affected area as told by your health care provider.  Keep all follow-up visits as told by your health care provider. This is important. These visits let your health care provider make sure a more serious infection is not developing. Contact a health care provider if:  You have a fever.  Your symptoms do not improve within 1-2 days of starting treatment.  Your bone or joint underneath the infected area becomes painful after the skin has healed.  Your infection returns in the same area or another area.  You notice a swollen bump in the infected area.  You develop new symptoms.  You have a general ill feeling (malaise) with muscle aches and pains. Get help right away if:  Your symptoms get worse.  You feel very sleepy.  You develop vomiting or diarrhea that persists.  You notice red streaks coming from the infected area.  Your red area gets larger or turns dark in color. This information is not intended to replace advice given to you by your health care provider. Make sure you discuss any questions you have with your health care provider. Document Released: 11/03/2004 Document Revised: 06/04/2015 Document Reviewed: 12/03/2014 Elsevier Interactive Patient Education  2017 [employer].

## 2016-12-26 NOTE — Progress Notes (Addendum)
Subjective:     Haley [name] is a 31 y.o. female who presents for evaluation of an abnormal vaginal discharge. Symptoms have been present for 3 weeks after a pap smear.  Patient states she noticed vaginal discharge and itching about 7 days later and used OTC meds without relief.  Patient was seen on 11/13 and prescribed Diflucan and Metronidazole, however her symptoms are not improving. Vaginal symptoms: local irritation, pain and excoriation. Patient rates the vaginal irritation 6/10 and states it comes and goes.  Contraception: abstinence. She denies abnormal bleeding, urinary symptoms of hematuria and abdominal pain. Sexually transmitted infection risk: very low risk of STD exposure. Menstrual flow: regular every 28-30 days.  Patient does masturbate, but denies use of toys and oral sex.  Patient states her urine flow is slow.  Patient states she also used a "diva cup" when she had her LMP.  Patient used a different bubble bath approximately at the end of October and states she changed her bubble bath when her symptoms started.  Patient admits to a history of frequent yeast infections, she has had 4 this year and states   The following portions of the patient's history were reviewed and updated as appropriate: allergies, current medications and past medical history.   Review of Systems Constitutional: negative Respiratory: negative Cardiovascular: negative Genitourinary:positive for vaginal discharge and labial swelling, decreased stream and hematuria, negative for abnormal menstrual periods, genital lesions and sexual problems, dysuria and frequency Integument/breast: negative Neurological: negative    Objective:    BP 122/70 (BP Location: Right Arm, Patient Position: Sitting, Cuff Size: Normal)   Pulse 96   Temp 98.5 F (36.9 C) (Oral)   Wt 113 lb 3.2 oz (51.3 kg)   SpO2 100%   BMI 21.74 kg/m  General appearance: alert, cooperative and moderate distress Head: Normocephalic, without  obvious abnormality, atraumatic Lungs: clear to auscultation bilaterally Heart: regular rate and rhythm, S1, S2 normal, no murmur, click, rub or gallop Abdomen: soft, non-tender; bowel sounds normal; no masses,  no organomegaly Pelvic: no adnexal masses or tenderness, no cervical motion tenderness, positive findings: vaginal discharge:  white, thick and odorless and severe labial majora/minor edema, with edematous clitoris Lymph nodes: Cervical, supraclavicular, and axillary nodes normal. Neurologic: Grossly normal    Assessment:    Cellulitis, Labia. Vulvovaginitis   Plan:    Symptomatic local care discussed. Scientist, clinical (histocompatibility and immunogenetics) distributed. Oral antifungal see orders. Oral antibiotics see orders. Abstinence from intercourse discussed. Avoid use of soaps and bubble bath.  Warm sitz baths, Vitamin C 1000mg  daily, Avoid use of tight fitting clothing/underwear. Drink cranberry juice.  Follow up in 2 days and 5 days.  Patient verbalizes understanding.

## 2016-12-27 ENCOUNTER — Telehealth: Payer: Self-pay

## 2016-12-28 ENCOUNTER — Telehealth: Payer: Self-pay | Admitting: Nurse Practitioner

## 2016-12-28 NOTE — Telephone Encounter (Signed)
Patient returned phone call.  Patient states, "I am much better".  Patient thanked me for my help.  Patient will call on Saturday to let me know if her symptoms have not resolved.

## 2016-12-28 NOTE — Telephone Encounter (Signed)
Called patient to check her status.  Reached voicemail, left message asking patient to return call.

## 2016-12-31 ENCOUNTER — Telehealth: Payer: Self-pay | Admitting: Nurse Practitioner

## 2016-12-31 NOTE — Telephone Encounter (Signed)
Patient called to inform of her status.  Patient states she is feeling much better.  Patient states she is taking her second Diflucan today.  Patient did not feel the need to return to the office today, but will call back in 2-3 days if her symptoms do not continue to improve.

## 2018-05-16 ENCOUNTER — Ambulatory Visit: Payer: Self-pay | Admitting: Physician Assistant

## 2018-05-16 ENCOUNTER — Other Ambulatory Visit: Payer: Self-pay

## 2018-05-16 VITALS — BP 100/65 | HR 92 | Temp 98.8°F | Resp 16 | Wt 116.0 lb

## 2018-05-16 DIAGNOSIS — N760 Acute vaginitis: Secondary | ICD-10-CM

## 2018-05-16 MED ORDER — FLUCONAZOLE 150 MG PO TABS: ORAL_TABLET | ORAL | 0 refills | Status: DC

## 2018-05-16 NOTE — Patient Instructions (Signed)
Vaginal Yeast infection, Adult   Take oral diflucan. Repeat dose in 72 hours if needed. If no improvement with this treatment, follow up with primary care or urgent care.   If any symptoms worsen or you develop new concernign symptoms,seek care at ED.  In the future, avoid topical triggers.     Vaginal yeast infection is a condition that causes vaginal discharge as well as soreness, swelling, and redness (inflammation) of the vagina. This is a common condition. Some women get this infection frequently. What are the causes? This condition is caused by a change in the normal balance of the yeast (candida) and bacteria that live in the vagina. This change causes an overgrowth of yeast, which causes the inflammation. What increases the risk? The condition is more likely to develop in women who:  Take antibiotic medicines.  Have diabetes.  Take birth control pills.  Are pregnant.  Douche often.  Have a weak body defense system (immune system).  Have been taking steroid medicines for a long time.  Frequently wear tight clothing. What are the signs or symptoms? Symptoms of this condition include:  White, thick, creamy vaginal discharge.  Swelling, itching, redness, and irritation of the vagina. The lips of the vagina (vulva) may be affected as well.  Pain or a burning feeling while urinating.  Pain during sex. How is this diagnosed? This condition is diagnosed based on:  Your medical history.  A physical exam.  A pelvic exam. Your health care provider will examine a sample of your vaginal discharge under a microscope. Your health care provider may send this sample for testing to confirm the diagnosis. How is this treated? This condition is treated with medicine. Medicines may be over-the-counter or prescription. You may be told to use one or more of the following:  Medicine that is taken by mouth (orally).  Medicine that is applied as a cream (topically).  Medicine  that is inserted directly into the vagina (suppository). Follow these instructions at home:  Lifestyle  Do not have sex until your health care provider approves. Tell your sex partner that you have a yeast infection. That person should go to his or her health care provider and ask if they should also be treated.  Do not wear tight clothes, such as pantyhose or tight pants.  Wear breathable cotton underwear. General instructions  Take or apply over-the-counter and prescription medicines only as told by your health care provider.  Eat more yogurt. This may help to keep your yeast infection from returning.  Do not use tampons until your health care provider approves.  Try taking a sitz bath to help with discomfort. This is a warm water bath that is taken while you are sitting down. The water should only come up to your hips and should cover your buttocks. Do this 3-4 times per day or as told by your health care provider.  Do not douche.  If you have diabetes, keep your blood sugar levels under control.  Keep all follow-up visits as told by your health care provider. This is important. Contact a health care provider if:  You have a fever.  Your symptoms go away and then return.  Your symptoms do not get better with treatment.  Your symptoms get worse.  You have new symptoms.  You develop blisters in or around your vagina.  You have blood coming from your vagina and it is not your menstrual period.  You develop pain in your abdomen. Summary  Vaginal yeast  infection is a condition that causes discharge as well as soreness, swelling, and redness (inflammation) of the vagina.  This condition is treated with medicine. Medicines may be over-the-counter or prescription.  Take or apply over-the-counter and prescription medicines only as told by your health care provider.  Do not douche. Do not have sex or use tampons until your health care provider approves.  Contact a health  care provider if your symptoms do not get better with treatment or your symptoms go away and then return. This information is not intended to replace advice given to you by your health care provider. Make sure you discuss any questions you have with your health care provider. Document Released: 11/03/2004 Document Revised: 06/12/2017 Document Reviewed: 06/12/2017 Elsevier Interactive Patient Education  2019 [employer].

## 2018-05-16 NOTE — Progress Notes (Signed)
05/16/2018 at 1:26 PM  Vickii Chafe / DOB: 1985/02/09 / MRN: [id number]  The patient has Fibrocystic breast, right on their problem list.  SUBJECTIVE  Haley [name] is a 33 y.o. female who complains of genital irritation, vaginal discharge and vaginal itching x 2 days. She denies vaginal odor, dysuria, hematuria, urinary frequency, urinary urgency, flank pain, abdominal pain, pelvic pain and cloudy malordorous urine. Has not tried anything for relief. LMP ~05/08/18. Sexually active w/ monogamous, but has not had sexual intercourse within the past few days due to discomfort. No concern for STDs.  Denies recent abx use.  Denies smoking. Denies hx of DM, HIV, or other immunocompromising conditions. Of note, pt has hx of yeast infections. Last seen and [facility]. Notes she has not had a yeast infection since that last time. She did use a bath bomb a few days ago and applied a topical sugar scrub all over. She is pretty sure this is what caused her symptoms because this has triggered yeast infections in past. Has avoided these types of things for the past 1.5 years. No other questions or concerns.   She  has a past medical history of Fibrocystic breast, right (10/11/2016), Irregular heart beat, and Premature ventricular contractions (03/2014).    Medications reviewed and updated by myself where necessary, and exist elsewhere in the encounter.   Haley [name] is allergic to latex and penicillins. She  reports that she has never smoked. She has never used smokeless tobacco. She reports that she does not drink alcohol or use drugs. She  reports previously being sexually active and has had partner(s) who are Female. She reports using the following method of birth control/protection: None. The patient  has a past surgical history that includes Wisdom tooth extraction.  Her family history includes Asthma in her mother; Diabetes in her father.  Review of Systems  Constitutional:  Negative for chills, diaphoresis and fever.  Gastrointestinal: Negative for nausea and vomiting.    OBJECTIVE  Her  weight is 116 lb (52.6 kg). Her oral temperature is 98.8 F (37.1 C). Her blood pressure is 100/65 and her pulse is 92. Her respiration is 16 and oxygen saturation is 100%.  The patient's body mass index is 22.28 kg/m.  Physical Exam Vitals signs reviewed. Exam conducted with a chaperone present.  Constitutional:      General: She is not in acute distress.    Appearance: She is well-developed. She is not ill-appearing or toxic-appearing.  HENT:     Head: Normocephalic and atraumatic.  Eyes:     Conjunctiva/sclera: Conjunctivae normal.  Neck:     Musculoskeletal: Normal range of motion.  Pulmonary:     Effort: Pulmonary effort is normal.  Genitourinary:    Vagina: Vaginal discharge (moderate amt of thick white curd like odorless discharge within vaginal canal) present. No tenderness.     Comments: Mild swelling of labia minora and majora noted.  Skin:    General: Skin is warm and dry.  Neurological:     Mental Status: She is alert and oriented to person, place, and time.     No results found for this or any previous visit (from the past 24 hour(s)).  ASSESSMENT & PLAN  Haley [name] was seen today for vaginal itching and vaginal discharge.  Diagnoses and all orders for this visit:  Vulvovaginitis -     fluconazole (DIFLUCAN) 150 MG tablet; Repeat dose in 72 hours if needed.  Hx and  PE findings consistent with yeast vulvovaginitis. Would recommend oral diflucan at this time. Repeat dose in 72 hours if needed.  If no improvement w/ tx plan  will need to f/u with family doctor or urgent care for further evaluation and management. Advised to seek care sooner at ED if any of her current symptoms worsen or she develops new concerning symptoms with current treatment plan. Pt voices understanding.    Tenna Delaine, Hershal Coria  [facility] 05/16/2018 1:26 PM

## 2018-09-18 ENCOUNTER — Other Ambulatory Visit: Payer: Self-pay

## 2018-09-18 ENCOUNTER — Ambulatory Visit: Payer: Self-pay | Admitting: Certified Nurse Midwife

## 2018-09-18 ENCOUNTER — Encounter: Payer: Self-pay | Admitting: Certified Nurse Midwife

## 2018-09-18 VITALS — BP 120/66 | HR 66 | Resp 16 | Ht 60.5 in | Wt 115.0 lb

## 2018-09-18 DIAGNOSIS — Z113 Encounter for screening for infections with a predominantly sexual mode of transmission: Secondary | ICD-10-CM

## 2018-09-18 DIAGNOSIS — Z124 Encounter for screening for malignant neoplasm of cervix: Secondary | ICD-10-CM

## 2018-09-18 DIAGNOSIS — B9689 Other specified bacterial agents as the cause of diseases classified elsewhere: Secondary | ICD-10-CM

## 2018-09-18 DIAGNOSIS — Z1151 Encounter for screening for human papillomavirus (HPV): Secondary | ICD-10-CM

## 2018-09-18 DIAGNOSIS — N760 Acute vaginitis: Secondary | ICD-10-CM

## 2018-09-18 DIAGNOSIS — Z01419 Encounter for gynecological examination (general) (routine) without abnormal findings: Secondary | ICD-10-CM

## 2018-09-18 MED ORDER — TINIDAZOLE 500 MG PO TABS: 2.0000 g | ORAL_TABLET | Freq: Every day | ORAL | 0 refills | Status: DC

## 2018-09-18 NOTE — Progress Notes (Signed)
GYNECOLOGY ANNUAL PREVENTATIVE CARE ENCOUNTER NOTE  History:     Haley [name] is a 33 y.o. [phone] female here for a routine annual gynecologic exam.  Current complaints: vaginal odor for past week after intercourse, denies discharge associated with odor.   Denies abnormal vaginal bleeding, discharge, pelvic pain, problems with intercourse or other gynecologic concerns.  Request screening for STDs.   Gynecologic History Patient's last menstrual period was 09/13/2018. Contraception: none Last Pap: 09/2016. Results were: normal with negative HPV  Obstetric History OB History  Gravida Para Term Preterm AB Living  3 2 2  0 1 2  SAB TAB Ectopic Multiple Live Births  0 1 0 0 2    # Outcome Date GA Lbr Len/2nd Weight Sex Delivery Anes PTL Lv  3 Term 02/06/12 [redacted]w[redacted]d 13:33 / 00:08 6 lb 0.1 oz (2.725 kg) F Vag-Spont Local  LIV  2 Term 08/25/10 [redacted]w[redacted]d 07:25 / 00:43 6 lb 5.4 oz (2.875 kg) M Vag-Spont EPI  LIV  1 TAB             Past Medical History:  Diagnosis Date  . Fibrocystic breast, right 10/11/2016  . Irregular heart beat   . Premature ventricular contractions 03/2014    Past Surgical History:  Procedure Laterality Date  . WISDOM TOOTH EXTRACTION      Current Outpatient Medications on File Prior to Visit  Medication Sig Dispense Refill  . Cholecalciferol (VITAMIN D-3 PO) Take by mouth daily.    . Probiotic Product (PROBIOTIC-10 PO) Take by mouth.    . vitamin C (ASCORBIC ACID) 500 MG tablet Take 500 mg by mouth daily.     No current facility-administered medications on file prior to visit.     Allergies  Allergen Reactions  . Latex Hives  . Penicillins Hives    Social History:  reports that she has never smoked. She has never used smokeless tobacco. She reports that she does not drink alcohol or use drugs.  Family History  Problem Relation Age of Onset  . Asthma Mother   . Diabetes Father     The following portions of the patient's history were reviewed and  updated as appropriate: allergies, current medications, past family history, past medical history, past social history, past surgical history and problem list.  Review of Systems Pertinent items noted in HPI and remainder of comprehensive ROS otherwise negative.  Physical Exam:  BP 120/66   Pulse 66   Resp 16   Ht 5' 0.5" (1.537 m)   Wt 115 lb (52.2 kg)   LMP 09/13/2018   BMI 22.09 kg/m  CONSTITUTIONAL: Well-developed, well-nourished female in no acute distress.  HENT:  Normocephalic, atraumatic, External right and left ear normal. Oropharynx is clear and moist EYES: Conjunctivae and EOM are normal. Pupils are equal, round, and reactive to light.  NECK: Normal range of motion, supple, no masses.  Normal thyroid.  SKIN: Skin is warm and dry. No rash noted. Not diaphoretic. No erythema. No pallor. MUSCULOSKELETAL: Normal range of motion. No tenderness.  No cyanosis, clubbing, or edema.  2+ distal pulses. NEUROLOGIC: Alert and oriented to person, place, and time. Normal reflexes, muscle tone coordination. No cranial nerve deficit noted. PSYCHIATRIC: Normal mood and affect. Normal behavior. Normal judgment and thought content. CARDIOVASCULAR: Normal heart rate noted, regular rhythm RESPIRATORY: Clear to auscultation bilaterally. Effort and breath sounds normal, no problems with respiration noted. BREASTS: Symmetric in size. No masses, skin changes, nipple drainage, or lymphadenopathy. ABDOMEN: Soft, normal bowel  sounds, no distention noted.  No tenderness, rebound or guarding.  PELVIC: Normal appearing external genitalia; normal appearing vaginal mucosa and cervix.  Scant amount of white thin discharge noted.  Pap smear obtained.  Normal uterine size, no other palpable masses, no uterine or adnexal tenderness.   Assessment and Plan:    1. Women's annual routine gynecological examination - Normal well woman examination  - Patient is not on birth control, not preventing pregnancy with BC or  condoms  - Encouraged patient to start taking PNV if she is actively trying to conceive  - Cytology - PAP( [place])  2. Screening for STDs (sexually transmitted diseases) - Patient request screening for STDs today  - HIV Antibody (routine testing w rflx) - RPR - Hepatitis C Antibody - Hepatitis B Surface AntiGEN  3. Bacterial vaginosis - Educated and discussed alternate therapies for bacterial vaginosis  - Patient reports using OTC honeypot treatment for BV without relief, requesting medication for treatment  - tinidazole (TINDAMAX) 500 MG tablet; Take 4 tablets (2,000 mg total) by mouth daily with breakfast. For two days  Dispense: 8 tablet; Refill: 0  Will follow up results of pap smear and manage accordingly. Routine preventative health maintenance measures emphasized. Please refer to After Visit Summary for other counseling recommendations.      Lajean Manes, [place] for [employer], [place]

## 2018-09-18 NOTE — Patient Instructions (Signed)
Alternative Vaginitis Therapies  1) soak in tub of warm water waist high with 1/2 cup of baking soda in water for ~ 20 mins. 2) soak 3 tampons in 1 tablespoon of fractionated (liquid form) coconut oil with 10 drops of Melaleuca (Tea Tree) essential oil, insert 1 saturated tampon vaginally at bedtime x 3 days. Both options are to be done after sexual intercourse, menses and when suspects BV and/or yeast infection. Advised that these alternatives will not replace the need to be evaluated, if sx's persist. You will need to seek care at an OB/GYN provider.  GO WHITE: Soap: UNSCENTED Dove (white box light green writing) Laundry detergent (underwear)- Dreft or [employer] unscented WHITE 100% cotton panties (NOT just cotton crouch) Sanitary napkin/panty liners: UNSCENTED.  If it doesn't SAY unscented it can have a scent/perfume    NO PERFUMES OR LOTIONS OR POTIONS in the vulvar area (may use regular [place]) Condoms: hypoallergenic only. Non dyed (no color) Toilet papers: white only Wash clothes: use a separate wash cloth. WHITE.  Wash in [place].   You can purchase Tea Tree Oil locally at:  [facility] [address] [place], [place] [address] [phone]  [facility] [address], [place] [address] [phone]  [place] [address] [place], [place] [address] [phone]

## 2018-09-19 LAB — CYTOLOGY - PAP
Chlamydia: NEGATIVE
Diagnosis: NEGATIVE
HPV: NOT DETECTED
Neisseria Gonorrhea: NEGATIVE
Trichomonas: NEGATIVE

## 2018-09-19 LAB — HEPATITIS C ANTIBODY
Hepatitis C Ab: NONREACTIVE
SIGNAL TO CUT-OFF: 0.04 (ref <1.00)

## 2018-09-19 LAB — RPR: RPR Ser Ql: REACTIVE — AB

## 2018-09-19 LAB — FLUORESCENT TREPONEMAL AB(FTA)-IGG-BLD: Fluorescent Treponemal ABS: NONREACTIVE

## 2018-09-19 LAB — HIV ANTIBODY (ROUTINE TESTING W REFLEX): HIV 1&2 Ab, 4th Generation: NONREACTIVE

## 2018-09-19 LAB — HEPATITIS B SURFACE ANTIGEN: Hepatitis B Surface Ag: NONREACTIVE

## 2018-09-19 LAB — RPR TITER: RPR Titer: 1:1 {titer} — ABNORMAL HIGH

## 2018-11-07 NOTE — Telephone Encounter (Signed)
done

## 2019-03-01 ENCOUNTER — Telehealth: Payer: Self-pay | Admitting: Family Medicine

## 2019-03-01 NOTE — Telephone Encounter (Signed)
Not our pt

## 2019-03-01 NOTE — Telephone Encounter (Signed)
Patient is calling to see if her immunization record are available? Patient is requesting immunization record immediately to start employment. Please advise [phone]

## 2019-11-26 ENCOUNTER — Encounter: Payer: Self-pay | Admitting: Certified Nurse Midwife

## 2019-11-26 ENCOUNTER — Ambulatory Visit: Payer: Self-pay | Admitting: Certified Nurse Midwife

## 2019-11-26 ENCOUNTER — Other Ambulatory Visit: Payer: Self-pay

## 2019-11-26 ENCOUNTER — Other Ambulatory Visit (HOSPITAL_COMMUNITY)
Admission: RE | Admit: 2019-11-26 | Discharge: 2019-11-26 | Disposition: A | Discharge status: Home / Self Care | Payer: Self-pay | Source: Ambulatory Visit | Attending: Certified Nurse Midwife | Admitting: Certified Nurse Midwife

## 2019-11-26 VITALS — BP 114/67 | HR 60 | Resp 16 | Ht 60.5 in | Wt 110.0 lb

## 2019-11-26 DIAGNOSIS — N760 Acute vaginitis: Secondary | ICD-10-CM

## 2019-11-26 DIAGNOSIS — Z01419 Encounter for gynecological examination (general) (routine) without abnormal findings: Secondary | ICD-10-CM

## 2019-11-26 DIAGNOSIS — B9689 Other specified bacterial agents as the cause of diseases classified elsewhere: Secondary | ICD-10-CM

## 2019-11-26 DIAGNOSIS — N939 Abnormal uterine and vaginal bleeding, unspecified: Secondary | ICD-10-CM

## 2019-11-26 LAB — POCT URINE PREGNANCY: Preg Test, Ur: NEGATIVE

## 2019-11-26 MED ORDER — TINIDAZOLE 500 MG PO TABS: 2.0000 g | ORAL_TABLET | Freq: Every day | ORAL | 1 refills | Status: DC

## 2019-11-26 NOTE — Progress Notes (Signed)
GYNECOLOGY ANNUAL PREVENTATIVE CARE ENCOUNTER NOTE  History:     Haley [name] is a 34 y.o. [phone] female here for a routine annual gynecologic exam.  Current complaints: abnormal bleeding and recurrent BV.   Denies pelvic pain, problems with intercourse or other gynecologic concerns.    Gynecologic History Patient's last menstrual period was 11/08/2019. Contraception: none Last Pap: 09/18/2018. Results were: normal with negative HPV  Obstetric History OB History  Gravida Para Term Preterm AB Living  3 2 2  0 1 2  SAB TAB Ectopic Multiple Live Births  0 1 0 0 2    # Outcome Date GA Lbr Len/2nd Weight Sex Delivery Anes PTL Lv  3 Term 02/06/12 [redacted]w[redacted]d 13:33 / 00:08 6 lb 0.1 oz (2.725 kg) F Vag-Spont Local  LIV  2 Term 08/25/10 [redacted]w[redacted]d 07:25 / 00:43 6 lb 5.4 oz (2.875 kg) M Vag-Spont EPI  LIV  1 TAB             Past Medical History:  Diagnosis Date  . Fibrocystic breast, right 10/11/2016  . Irregular heart beat   . Premature ventricular contractions 03/2014    Past Surgical History:  Procedure Laterality Date  . WISDOM TOOTH EXTRACTION      Current Outpatient Medications on File Prior to Visit  Medication Sig Dispense Refill  . Cholecalciferol (VITAMIN D-3 PO) Take by mouth daily.    . Probiotic Product (PROBIOTIC-10 PO) Take by mouth.    . vitamin C (ASCORBIC ACID) 500 MG tablet Take 500 mg by mouth daily.     No current facility-administered medications on file prior to visit.    Allergies  Allergen Reactions  . Latex Hives  . Penicillins Hives    Social History:  reports that she has never smoked. She has never used smokeless tobacco. She reports that she does not drink alcohol and does not use drugs.  Family History  Problem Relation Age of Onset  . Asthma Mother   . Diabetes Father     The following portions of the patient's history were reviewed and updated as appropriate: allergies, current medications, past family history, past medical history, past  social history, past surgical history and problem list.  Review of Systems Pertinent items noted in HPI and remainder of comprehensive ROS otherwise negative.  Physical Exam:  BP 114/67   Pulse 60   Resp 16   Ht 5' 0.5" (1.537 m)   Wt 110 lb (49.9 kg)   LMP 11/08/2019   BMI 21.13 kg/m  CONSTITUTIONAL: Well-developed, well-nourished female in no acute distress.  HENT:  Normocephalic, atraumatic, External right and left ear normal. Oropharynx is clear and moist EYES: Conjunctivae and EOM are normal. Pupils are equal, round, and reactive to light. NECK: Normal range of motion, supple, no masses.  Normal thyroid.  SKIN: Skin is warm and dry. No rash noted. Not diaphoretic. No erythema. No pallor. MUSCULOSKELETAL: Normal range of motion. No tenderness.  No cyanosis, clubbing, or edema.  2+ distal pulses. NEUROLOGIC: Alert and oriented to person, place, and time. Normal reflexes, muscle tone coordination.  PSYCHIATRIC: Normal mood and affect. Normal behavior. Normal judgment and thought content. CARDIOVASCULAR: Normal heart rate noted, regular rhythm RESPIRATORY: Clear to auscultation bilaterally. Effort and breath sounds normal, no problems with respiration noted. BREASTS: Symmetric in size. No masses, tenderness, skin changes, nipple drainage, or lymphadenopathy bilaterally. Performed in the presence of a chaperone. ABDOMEN: Soft, no distention noted.  No tenderness, rebound or guarding.  PELVIC: Normal  appearing external genitalia and urethral meatus; normal appearing vaginal mucosa and cervix. Moderate amount of white thin discharge present.  Pap smear obtained.  Normal uterine size, no other palpable masses, no uterine or adnexal tenderness.  Performed in the presence of a chaperone.   Assessment and Plan:    1. Well woman exam with routine gynecological exam - Normal well woman examination  - Cytology - PAP( [place])  2. Abnormal uterine bleeding (AUB) - Patient reports that she  had miscarriage in February, had normal cycles since then until last month and this month.  - Patient reports having breakthrough bleeding in September and 14 days of bleeding in October  - UPT negative today, patient is not wanting to be on birth control to regular abnormalities  - POCT urine pregnancy  3. Bacterial vaginosis - recurrent BV  - patient reports that alternative therapies were tried and does not work  - tinidazole (TINDAMAX) 500 MG tablet; Take 4 tablets (2,000 mg total) by mouth daily with breakfast. For two days  Dispense: 8 tablet; Refill: 1  Will follow up results of pap smear and manage accordingly. Routine preventative health maintenance measures emphasized. Please refer to After Visit Summary for other counseling recommendations.      Lajean Manes, [address] for [employer], [place]

## 2019-11-28 LAB — CYTOLOGY - PAP
Chlamydia: NEGATIVE
Comment: NEGATIVE
Comment: NEGATIVE
Comment: NORMAL
Diagnosis: NEGATIVE
High risk HPV: NEGATIVE
Neisseria Gonorrhea: NEGATIVE

## 2020-01-08 ENCOUNTER — Ambulatory Visit: Payer: Self-pay | Admitting: Obstetrics and Gynecology

## 2020-01-10 ENCOUNTER — Ambulatory Visit: Payer: Self-pay | Admitting: Certified Nurse Midwife

## 2020-01-21 ENCOUNTER — Ambulatory Visit: Payer: Self-pay | Admitting: Certified Nurse Midwife

## 2020-04-14 ENCOUNTER — Other Ambulatory Visit (HOSPITAL_COMMUNITY)
Admission: RE | Admit: 2020-04-14 | Discharge: 2020-04-14 | Disposition: A | Discharge status: Home / Self Care | Payer: Self-pay | Source: Ambulatory Visit | Attending: Advanced Practice Midwife | Admitting: Advanced Practice Midwife

## 2020-04-14 ENCOUNTER — Ambulatory Visit (INDEPENDENT_AMBULATORY_CARE_PROVIDER_SITE_OTHER): Payer: Self-pay | Admitting: Advanced Practice Midwife

## 2020-04-14 ENCOUNTER — Other Ambulatory Visit: Payer: Self-pay

## 2020-04-14 ENCOUNTER — Encounter: Payer: Self-pay | Admitting: Advanced Practice Midwife

## 2020-04-14 VITALS — BP 110/71 | HR 60 | Resp 16 | Ht 60.5 in | Wt 112.0 lb

## 2020-04-14 DIAGNOSIS — N930 Postcoital and contact bleeding: Secondary | ICD-10-CM

## 2020-04-14 DIAGNOSIS — N939 Abnormal uterine and vaginal bleeding, unspecified: Secondary | ICD-10-CM

## 2020-04-14 DIAGNOSIS — N941 Unspecified dyspareunia: Secondary | ICD-10-CM

## 2020-04-14 DIAGNOSIS — N926 Irregular menstruation, unspecified: Secondary | ICD-10-CM

## 2020-04-14 NOTE — Progress Notes (Signed)
  GYNECOLOGY PROGRESS NOTE  History:  35 y.o. [id number] presents to [facility] office today for problem gyn visit. She reports bleeding with intercourse and pain following intercourse, especially if it occurs in the week before her period. She has regular monthly periods, lasting 4-5 days and no other bleeding except following intercourse. She is trying to conceive so does not desire contraception.  She denies h/a, dizziness, shortness of breath, n/v, or fever/chills.    The following portions of the patient's history were reviewed and updated as appropriate: allergies, current medications, past family history, past medical history, past social history, past surgical history and problem list. Last pap smear on 11/26/2019 was normal, negative HPV.  Review of Systems:  Pertinent items are noted in HPI.   Objective:  Physical Exam Blood pressure 110/71, pulse 60, resp. rate 16, height 5' 0.5" (1.537 m), weight 112 lb (50.8 kg), last menstrual period 04/13/2020. VS reviewed, nursing note reviewed,  Constitutional: well developed, well nourished, no distress HEENT: normocephalic CV: normal rate Pulm/chest wall: normal effort Breast Exam: deferred Abdomen: soft Neuro: alert and oriented x 3 Skin: warm, dry Psych: affect normal Pelvic exam: Cervix pink, visually closed, without lesion, scant light red bleeding, vaginal walls and external genitalia normal Bimanual exam: Cervix 0/long/high, firm, anterior, neg CMT, uterus nontender, nonenlarged, adnexa without tenderness, enlargement, or mass  Assessment & Plan:  1. Irregular bleeding *- Cervicovaginal ancillary only( [place])  2. Abnormal uterine bleeding (AUB) --Possible causes include hormonal changes, unlikely PCOS given regular menses; fibroids; adenomyosis. --Exam today wnl in office.  Outpatient [place] to evaluate. - US PELVIC COMPLETE WITH TRANSVAGINAL; Future  3. Postcoital bleeding  4. Dyspareunia in female --Pain is mostly  on left lower side and only occurs after intercourse, lasts 1-2 days and is cramping intermittent pain  Fatima Blank, CNM 2:08 PM

## 2020-04-15 LAB — CERVICOVAGINAL ANCILLARY ONLY
Bacterial Vaginitis (gardnerella): POSITIVE — AB
Candida Glabrata: NEGATIVE
Candida Vaginitis: NEGATIVE
Chlamydia: NEGATIVE
Comment: NEGATIVE
Comment: NEGATIVE
Comment: NEGATIVE
Comment: NEGATIVE
Comment: NEGATIVE
Comment: NORMAL
Neisseria Gonorrhea: NEGATIVE
Trichomonas: NEGATIVE

## 2020-04-16 ENCOUNTER — Other Ambulatory Visit: Payer: Self-pay

## 2020-04-16 ENCOUNTER — Ambulatory Visit (INDEPENDENT_AMBULATORY_CARE_PROVIDER_SITE_OTHER): Payer: Self-pay

## 2020-04-16 DIAGNOSIS — N939 Abnormal uterine and vaginal bleeding, unspecified: Secondary | ICD-10-CM

## 2020-04-18 ENCOUNTER — Other Ambulatory Visit: Payer: Self-pay | Admitting: Advanced Practice Midwife

## 2020-04-18 MED ORDER — METRONIDAZOLE 500 MG PO TABS: 500.0000 mg | ORAL_TABLET | Freq: Two times a day (BID) | ORAL | 0 refills | Status: DC

## 2020-04-18 NOTE — Progress Notes (Signed)
Rx for Flagyl sent to pharmacy for positive bacterial vaginosis on cervicovaginal swab

## 2020-04-20 ENCOUNTER — Other Ambulatory Visit: Payer: Self-pay | Admitting: *Deleted

## 2020-04-20 ENCOUNTER — Other Ambulatory Visit: Payer: Self-pay | Admitting: Advanced Practice Midwife

## 2020-04-20 DIAGNOSIS — N83202 Unspecified ovarian cyst, left side: Secondary | ICD-10-CM

## 2020-04-20 MED ORDER — METRONIDAZOLE 500 MG PO TABS: 500.0000 mg | ORAL_TABLET | Freq: Two times a day (BID) | ORAL | 0 refills | Status: AC

## 2020-04-20 NOTE — Telephone Encounter (Signed)
Pt called stating that she has changed pharmacy to [facility] in [place] and is requesting that her RX for Flagyl be sent there instead of [facility].  RX switched.

## 2020-06-10 ENCOUNTER — Other Ambulatory Visit: Payer: Self-pay

## 2020-06-10 ENCOUNTER — Ambulatory Visit (INDEPENDENT_AMBULATORY_CARE_PROVIDER_SITE_OTHER): Payer: Self-pay

## 2020-06-10 DIAGNOSIS — N83202 Unspecified ovarian cyst, left side: Secondary | ICD-10-CM

## 2020-07-07 ENCOUNTER — Other Ambulatory Visit: Payer: Self-pay

## 2020-07-07 ENCOUNTER — Encounter: Payer: Self-pay | Admitting: Advanced Practice Midwife

## 2020-07-07 ENCOUNTER — Ambulatory Visit (INDEPENDENT_AMBULATORY_CARE_PROVIDER_SITE_OTHER): Payer: Self-pay | Admitting: Advanced Practice Midwife

## 2020-07-07 VITALS — BP 104/76 | HR 71 | Resp 16 | Ht 60.5 in | Wt 110.0 lb

## 2020-07-07 DIAGNOSIS — B9689 Other specified bacterial agents as the cause of diseases classified elsewhere: Secondary | ICD-10-CM

## 2020-07-07 DIAGNOSIS — N83209 Unspecified ovarian cyst, unspecified side: Secondary | ICD-10-CM

## 2020-07-07 DIAGNOSIS — N760 Acute vaginitis: Secondary | ICD-10-CM | POA: Insufficient documentation

## 2020-07-07 DIAGNOSIS — Z3169 Encounter for other general counseling and advice on procreation: Secondary | ICD-10-CM

## 2020-07-07 NOTE — Progress Notes (Signed)
  GYNECOLOGY PROGRESS NOTE  History:  35 y.o. [id number] presents to [facility] office today for problem gyn visit. She had outpatient Gyn ultrasound on 06/10/20 and is here to review results.  She reports pelvic pain monthly, especially after her menses and is having mild pain today.   She denies h/a, dizziness, shortness of breath, n/v, or fever/chills.    The following portions of the patient's history were reviewed and updated as appropriate: allergies, current medications, past family history, past medical history, past social history, past surgical history and problem list. Last pap smear on 11/26/19 was normal, negative HRHPV.  There are no preventive care reminders to display for this patient.   Review of Systems:  Pertinent items are noted in HPI.   Objective:  Physical Exam Blood pressure 104/76, pulse 71, resp. rate 16, height 5' 0.5" (1.537 m), weight 110 lb (49.9 kg), last menstrual period 06/12/2020. VS reviewed, nursing note reviewed,  Constitutional: well developed, well nourished, no distress HEENT: normocephalic CV: normal rate Pulm/chest wall: normal effort Breast Exam: deferred Abdomen: soft Neuro: alert and oriented x 3 Skin: warm, dry Psych: affect normal Pelvic exam: Deferred  Assessment & Plan:  1. Hemorrhagic cyst of ovary --US 04/2020 showed possible hemorrhagic cyst vs endometrioma of left ovary.  Repeat US 06/2020 shows hemorrhagic cyst on right, resolved cyst on left. --This is consistent with ovulatory pattern, hemorrhagic cysts and not endometrioma --Pt desires pregnancy. Discussed options to treat painful cysts including OCPs/patches/Nuvaring for a few months then resume trying to become pregnant or continuing to try to become pregnant. Pregnancy will also suppress ovulation and improve cysts/pain. --Pt desires to continue efforts to become pregnant at this time  2. Infertility counseling --Pt and her husband have been trying to become pregnant x 1  year. She had a miscarriage 03/2019 but no pregnancy since that time. --Periods are regular and now US shows likely ovulation with hemorrhagic cysts --Discussed options, including referral to Dr Hughie Closs for fertilty evaluation, and options within our practice including meeting with MD and possible labwork, hysterosalpingogram or hysterosalpingo contrast sonography.    --I do not recommend ovulation medications at this time given hemorrhagic cysts related to ovulation but pt may review with MD. --Appt with Dr Harolyn Rutherford in 3 months --Contact information given for Dr Yalcincaya/[facility]  3. Bacterial vaginosis --Recurrent, resolves with tea trea oil and/or boric acid suppositories but returns ~ 36 hours after intercourse. --Condoms or withdrawal during intercourse for a short period --Continue probiotic daily  Fatima Blank, CNM 11:30 AM

## 2020-07-07 NOTE — Patient Instructions (Signed)
[facility] [address] Janit Bern [address], [place], [place] [address] Hours:  Phone: [phone]

## 2020-07-21 ENCOUNTER — Other Ambulatory Visit: Payer: Self-pay | Admitting: *Deleted

## 2020-07-21 DIAGNOSIS — Z3169 Encounter for other general counseling and advice on procreation: Secondary | ICD-10-CM

## 2020-08-27 ENCOUNTER — Telehealth: Payer: Self-pay | Admitting: *Deleted

## 2020-08-27 ENCOUNTER — Other Ambulatory Visit: Payer: Self-pay | Admitting: Obstetrics & Gynecology

## 2020-08-27 DIAGNOSIS — N979 Female infertility, unspecified: Secondary | ICD-10-CM

## 2020-08-27 NOTE — Telephone Encounter (Signed)
----- Message from Nelta Numbers, RN sent at 08/27/2020  1:26 PM EDT ----- Regarding: RE: Procedure We have 9-13 schedule is all open  Christine  ----- Message ----- From: Elmon Else, NT Sent: 08/27/2020   1:16 PM EDT To: [place], # Subject: FW: Procedure                                  Good Afternoon! Can someone let me know Dr. Arther Abbott availability for a virtual with this patient 10/07/20 or after. Thank you for your time. ----- Message ----- From: Osborne Oman, MD Sent: 08/27/2020  11:36 AM EDT To: Elmon Else, NT Subject: RE: Procedure                                  We can do a virtual from either location.  Thank you ----- Message ----- From: Elmon Else, NT Sent: 08/27/2020  11:03 AM EDT To: Osborne Oman, MD Subject: RE: Procedure                                  Good Morning! Spoke with patient this morning, she would like to have HSG ordered for [facility] and then she will decided what she wants or needs to do from there. You will only be in [place] and [place] in August and September, can she do a virtual F/U visit with you? You wanted to see her in 3 months from 07/07/2020. Thank you for your time. ----- Message ----- From: Osborne Oman, MD Sent: 08/27/2020   9:25 AM EDT To: Elmon Else, NT, # Subject: RE: Procedure                                  We can order a HSG through [facility] radiology, they have a protocol that depends on menstrual period timing. She can also go through [place] or [facility] and get all the workup and management in one place as we are not infertility specialists and there are limits to what we can do.  But is she wants to start with [place], she can get labs, HSG, her partner needs to get a semen analysis (he needs to go through his PCP or Urologist).  She can see me on Monday to discuss this is she wants (currently seem to have a few spots open on 7/25  schedule).  Thank you!   [facility]   ----- Message ----- From: Elvera Maria, CNM Sent: 08/27/2020   5:09 AM EDT To: Osborne Oman, MD Subject: FW: Procedure                                  Dr Collene Mares at [facility] sent me the message below.  I did not discuss an exact procedure but I discussed options for treating infertility with this pt and mentioned an HSG. Is that something we can do, or do we always send to [place]?  Let me know, or let Alda Berthold know to schedule this patient with you for fertility work up if that is possible.  Thank you!  Fatima Blank, CNM   ----- Message ----- From: Elmon Else, NT Sent: 08/25/2020   9:33 AM EDT To: Elvera Maria, CNM Subject: Procedure                                      Good Morning! This patient called to see if you spoke with Dr. Harolyn Rutherford about a procedure that you stated to her that she would need to check her fertility. Patient stated that you told her that you would speak with Dr. Harolyn Rutherford to see if this is something that she can do in office instead of her going to Dr. Kerin Perna. Please let me know prior to Dr. Harolyn Rutherford being in [place] on Monday, July 25th. Thank you for your time.

## 2020-08-27 NOTE — Progress Notes (Signed)
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 Needs detailed evaluation, will make another appointment to discuss details. Declined referral to REI for now.  Verita Schneiders, MD

## 2020-08-27 NOTE — Telephone Encounter (Signed)
Left patient a message to call and schedule MyChart visit with Dr. Harolyn Rutherford for 10/20/2020 when she is at [facility].

## 2020-09-04 ENCOUNTER — Telehealth: Payer: Self-pay | Admitting: *Deleted

## 2020-09-04 NOTE — Telephone Encounter (Signed)
Patient given contact information to call and schedule HSG. No prior Josem Kaufmann is needed per [employer] Rep. Elva A. Ref. # Elva A. 09/04/2020.

## 2020-09-10 ENCOUNTER — Other Ambulatory Visit: Payer: 59

## 2020-10-05 ENCOUNTER — Ambulatory Visit
Admission: RE | Admit: 2020-10-05 | Discharge: 2020-10-05 | Disposition: A | Discharge status: Home / Self Care | Payer: 59 | Source: Ambulatory Visit | Attending: Obstetrics & Gynecology | Admitting: Obstetrics & Gynecology

## 2020-10-05 ENCOUNTER — Telehealth: Payer: Self-pay | Admitting: *Deleted

## 2020-10-05 DIAGNOSIS — N979 Female infertility, unspecified: Secondary | ICD-10-CM

## 2020-10-05 NOTE — Telephone Encounter (Signed)
My chart message sent to pt's regarding her HSG results and Dr Mickey Farber recommendation.

## 2020-10-05 NOTE — Telephone Encounter (Signed)
-----   Message from Osborne Oman, MD sent at 10/05/2020  1:04 PM EDT ----- Given occlusion of left fallopian tube and possible adhesions around right fallopian tube, patient needs to follow up with Infertility specialist (Dr. 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 Please call to inform patient of results and recommendations.    Verita Schneiders, MD

## 2020-10-20 ENCOUNTER — Telehealth (INDEPENDENT_AMBULATORY_CARE_PROVIDER_SITE_OTHER): Payer: 59 | Admitting: Obstetrics & Gynecology

## 2020-10-20 ENCOUNTER — Encounter: Payer: Self-pay | Admitting: Obstetrics & Gynecology

## 2020-10-20 DIAGNOSIS — N979 Female infertility, unspecified: Secondary | ICD-10-CM

## 2020-10-20 NOTE — Progress Notes (Signed)
I connected with  Haley [name] on 10/20/20 at  9:35 AM EDT by telephone and verified that I am speaking with the correct person using two identifiers.   I discussed the limitations, risks, security and privacy concerns of performing an evaluation and management service by telephone and the availability of in person appointments. I also discussed with the patient that there may be a patient responsible charge related to this service. The patient expressed understanding and agreed to proceed.  Crosby Oyster, RN 10/20/2020  9:49 AM

## 2020-10-20 NOTE — Progress Notes (Signed)
GYNECOLOGY VIRTUAL VISIT ENCOUNTER NOTE  Provider location: [facility]   Patient location: Home  I connected with Vickii Chafe on 10/20/20 at  9:35 AM EDT by MyChart Video Encounter and verified that I am speaking with the correct person using two identifiers.   I discussed the limitations, risks, security and privacy concerns of performing an evaluation and management service virtually and the availability of in person appointments. I also discussed with the patient that there may be a patient responsible charge related to this service. The patient expressed understanding and agreed to proceed.   History:  ROSEANNE BIGWOOD is a 35 y.o. 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 She denies any abnormal vaginal discharge, bleeding, pelvic pain or other concerns.       Past Medical History:  Diagnosis Date   Fibrocystic breast, right 10/11/2016   Irregular heart beat    Premature ventricular contractions 03/2014   Past Surgical History:  Procedure Laterality Date   WISDOM TOOTH EXTRACTION     The following portions of the patient's history were reviewed and updated as appropriate: allergies, current medications, past family history, past medical history, past social history, past surgical history and problem list.   Health Maintenance:  Normal pap and negative HRHPV on 11/26/2019.  Review of Systems:  Pertinent items noted in HPI and remainder of comprehensive ROS otherwise negative.  Physical Exam:   General:  Alert, oriented and cooperative. Patient appears to be in no acute distress.  Mental Status: Normal mood and affect. Normal behavior. Normal judgment and thought content.   Respiratory: Normal respiratory effort, no problems with respiration noted  Rest of physical exam deferred due to type of encounter  Labs and Imaging No results found for this or any previous visit (from  the past 336 hour(s)). DG Hysterogram (HSG)  Result Date: 10/05/2020 CLINICAL DATA:  Secondary infertility. EXAM: HYSTEROSALPINGOGRAM TECHNIQUE: Following cleansing of the cervix and vagina with Betadine solution, a hysterosalpingogram was performed using a 5-French hysterosalpingogram catheter and Omnipaque 300 contrast. The patient tolerated the examination without difficulty. COMPARISON:  None. FLUOROSCOPY TIME:  Radiation Exposure Index (as provided by the fluoroscopic device): 12.0 mGy If the device does not provide the exposure index: Fluoroscopy Time:  1 minute 42 seconds Number of Acquired Images:  0 FINDINGS: Endometrial Cavity: Normal appearance. No signs of Mullerian duct anomaly or other significant abnormality. Right Fallopian Tube: Patent, with intraperitoneal spill of contrast demonstrated. The spilled contrast largely remains within a loculated collection in the right adnexa, which suggest the presence of peri-tubal adhesions. 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 Other:  None. IMPRESSION: Right fallopian tube is patent, however spilled contrast appears loculated suggesting the presence of peri-tubal adhesions. 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 Normal appearance of endometrial cavity. Electronically Signed   By: Marlaine Hind M.D.   On: 10/05/2020 10:57       Assessment and Plan:     1. Infertility, female Results reviewed in detail.  All questions addressed. Recommended referral to Dr.Yalcinkaya, she already has appointment with them next month for further evaluation.      I discussed the assessment and treatment plan with the patient. The patient was provided an opportunity to ask questions and all were answered. The patient agreed with the plan  and demonstrated an understanding of the instructions.   The patient was advised to call back or  seek an in-person evaluation/go to the ED if the symptoms worsen or if the condition fails to improve as anticipated.  I provided 10 minutes of face-to-face time during this encounter.   Verita Schneiders, MD [facility] [place] '

## 2020-12-03 ENCOUNTER — Other Ambulatory Visit: Payer: Self-pay

## 2020-12-03 ENCOUNTER — Encounter: Payer: Self-pay | Admitting: Obstetrics & Gynecology

## 2020-12-03 ENCOUNTER — Other Ambulatory Visit (HOSPITAL_COMMUNITY)
Admission: RE | Admit: 2020-12-03 | Discharge: 2020-12-03 | Disposition: A | Discharge status: Home / Self Care | Payer: 59 | Source: Ambulatory Visit | Attending: Obstetrics & Gynecology | Admitting: Obstetrics & Gynecology

## 2020-12-03 ENCOUNTER — Ambulatory Visit (INDEPENDENT_AMBULATORY_CARE_PROVIDER_SITE_OTHER): Payer: 59 | Admitting: Obstetrics & Gynecology

## 2020-12-03 VITALS — BP 120/68 | HR 62 | Ht 60.0 in | Wt 114.0 lb

## 2020-12-03 DIAGNOSIS — Z113 Encounter for screening for infections with a predominantly sexual mode of transmission: Secondary | ICD-10-CM | POA: Diagnosis not present

## 2020-12-03 DIAGNOSIS — Z01419 Encounter for gynecological examination (general) (routine) without abnormal findings: Secondary | ICD-10-CM | POA: Diagnosis not present

## 2020-12-03 NOTE — Progress Notes (Signed)
GYNECOLOGY ANNUAL PREVENTATIVE CARE ENCOUNTER NOTE  History:     Haley [name] is a 35 y.o. [phone] female here for a routine annual gynecologic exam.  Current complaints: none.  Undergoing secondary infertility management with REI currently.   Denies abnormal vaginal bleeding, discharge, pelvic pain, problems with intercourse or other gynecologic concerns.    Gynecologic History Patient's last menstrual period was 11/21/2020. Contraception: none Last Pap: 11/26/2019. Result was normal with negative HPV   Obstetric History OB History  Gravida Para Term Preterm AB Living  3 2 2  0 1 2  SAB IAB Ectopic Multiple Live Births  0 1 0 0 2    # Outcome Date GA Lbr Len/2nd Weight Sex Delivery Anes PTL Lv  3 Term 02/06/12 104w1d 13:33 / 00:08 6 lb 0.1 oz (2.725 kg) F Vag-Spont Local  LIV  2 Term 08/25/10 [redacted]w[redacted]d 07:25 / 00:43 6 lb 5.4 oz (2.875 kg) M Vag-Spont EPI  LIV  1 IAB             Past Medical History:  Diagnosis Date   Fibrocystic breast, right 10/11/2016   Irregular heart beat    Premature ventricular contractions 03/2014    Past Surgical History:  Procedure Laterality Date   WISDOM TOOTH EXTRACTION      Current Outpatient Medications on File Prior to Visit  Medication Sig Dispense Refill   doxycycline (VIBRAMYCIN) 100 MG capsule Take 100 mg by mouth 2 (two) times daily.     Prenatal Vit-Fe Fumarate-FA (PRENATAL VITAMIN PO) Take by mouth.     Probiotic Product (PROBIOTIC-10 PO) Take by mouth.     No current facility-administered medications on file prior to visit.    Allergies  Allergen Reactions   Corylus Nausea And Vomiting   Latex Hives   Penicillins Hives    Social History:  reports that she has never smoked. She has never used smokeless tobacco. She reports that she does not drink alcohol and does not use drugs.  Family History  Problem Relation Age of Onset   Asthma Mother    Diabetes Father     The following portions of the patient's history were  reviewed and updated as appropriate: allergies, current medications, past family history, past medical history, past social history, past surgical history and problem list.  Review of Systems Pertinent items noted in HPI and remainder of comprehensive ROS otherwise negative.  Physical Exam:  BP 120/68   Pulse 62   Ht 5' (1.524 m)   Wt 114 lb (51.7 kg)   LMP 11/21/2020   BMI 22.26 kg/m  CONSTITUTIONAL: Well-developed, well-nourished female in no acute distress.  HENT:  Normocephalic, atraumatic, External right and left ear normal.  EYES: Conjunctivae and EOM are normal. Pupils are equal, round, and reactive to light. No scleral icterus.  NECK: Normal range of motion, supple, no masses.  Normal thyroid.  SKIN: Skin is warm and dry. No rash noted. Not diaphoretic. No erythema. No pallor. MUSCULOSKELETAL: Normal range of motion. No tenderness.  No cyanosis, clubbing, or edema. NEUROLOGIC: Alert and oriented to person, place, and time. Normal reflexes, muscle tone coordination.  PSYCHIATRIC: Normal mood and affect. Normal behavior. Normal judgment and thought content. CARDIOVASCULAR: Normal heart rate noted, regular rhythm RESPIRATORY: Clear to auscultation bilaterally. Effort and breath sounds normal, no problems with respiration noted. BREASTS: Symmetric in size. No masses, tenderness, skin changes, nipple drainage, or lymphadenopathy bilaterally. Performed in the presence of a chaperone. ABDOMEN: Soft, no distention noted.  No tenderness, rebound or guarding.  PELVIC: Deferred   Assessment and Plan:    1. Routine screening for STI (sexually transmitted infection) Routine STI screen done as per her request, will follow up results and manage accordingly. - Hepatitis B surface antigen - Hepatitis C antibody - HIV Antibody (routine testing w rflx) - Cervicovaginal ancillary only - RPR  2. Well woman exam with routine gynecological exam Pap smear screening is up to date and normal  breast examination. Declines flu vaccine today.  Routine preventative health maintenance measures emphasized. Please refer to After Visit Summary for other counseling recommendations.      Verita Schneiders, MD, [facility] [employer] [facility], [place]

## 2020-12-03 NOTE — Patient Instructions (Signed)
Preventive Care 21-33 Years Old, Female Preventive care refers to lifestyle choices and visits with your health care provider that can promote health and wellness. This includes: A yearly physical exam. This is also called an annual wellness visit. Regular dental and eye exams. Immunizations. Screening for certain conditions. Healthy lifestyle choices, such as: Eating a healthy diet. Getting regular exercise. Not using drugs or products that contain nicotine and tobacco. Limiting alcohol use. What can I expect for my preventive care visit? Physical exam Your health care provider may check your: Height and weight. These may be used to calculate your BMI (body mass index). BMI is a measurement that tells if you are at a healthy weight. Heart rate and blood pressure. Body temperature. Skin for abnormal spots. Counseling Your health care provider may ask you questions about your: Past medical problems. Family's medical history. Alcohol, tobacco, and drug use. Emotional well-being. Home life and relationship well-being. Sexual activity. Diet, exercise, and sleep habits. Work and work environment. Access to firearms. Method of birth control. Menstrual cycle. Pregnancy history. What immunizations do I need? Vaccines are usually given at various ages, according to a schedule. Your health care provider will recommend vaccines for you based on your age, medical history, and lifestyle or other factors, such as travel or where you work. What tests do I need? Blood tests Lipid and cholesterol levels. These may be checked every 5 years starting at age 20. Hepatitis C test. Hepatitis B test. Screening Diabetes screening. This is done by checking your blood sugar (glucose) after you have not eaten for a while (fasting). STD (sexually transmitted disease) testing, if you are at risk. BRCA-related cancer screening. This may be done if you have a family history of breast, ovarian, tubal, or  peritoneal cancers. Pelvic exam and Pap test. This may be done every 3 years starting at age 21. Starting at age 30, this may be done every 5 years if you have a Pap test in combination with an HPV test. Talk with your health care provider about your test results, treatment options, and if necessary, the need for more tests. Follow these instructions at home: Eating and drinking  Eat a healthy diet that includes fresh fruits and vegetables, whole grains, lean protein, and low-fat dairy products. Take vitamin and mineral supplements as recommended by your health care provider. Do not drink alcohol if: Your health care provider tells you not to drink. You are pregnant, may be pregnant, or are planning to become pregnant. If you drink alcohol: Limit how much you have to 0-1 drink a day. Be aware of how much alcohol is in your drink. In the [place] one drink equals one 12 oz bottle of beer (355 mL), one 5 oz glass of wine (148 mL), or one 1 oz glass of hard liquor (44 mL). Lifestyle Take daily care of your teeth and gums. Brush your teeth every morning and night with fluoride toothpaste. Floss one time each day. Stay active. Exercise for at least 30 minutes 5 or more days each week. Do not use any products that contain nicotine or tobacco, such as cigarettes, e-cigarettes, and chewing tobacco. If you need help quitting, ask your health care provider. Do not use drugs. If you are sexually active, practice safe sex. Use a condom or other form of protection to prevent STIs (sexually transmitted infections). If you do not wish to become pregnant, use a form of birth control. If you plan to become pregnant, see your health care provider   for a prepregnancy visit. Find healthy ways to cope with stress, such as: Meditation, yoga, or listening to music. Journaling. Talking to a trusted person. Spending time with friends and family. Safety Always wear your seat belt while driving or riding in a  vehicle. Do not drive: If you have been drinking alcohol. Do not ride with someone who has been drinking. When you are tired or distracted. While texting. Wear a helmet and other protective equipment during sports activities. If you have firearms in your house, make sure you follow all gun safety procedures. Seek help if you have been physically or sexually abused. What's next? Go to your health care provider once a year for an annual wellness visit. Ask your health care provider how often you should have your eyes and teeth checked. Stay up to date on all vaccines. This information is not intended to replace advice given to you by your health care provider. Make sure you discuss any questions you have with your health care provider. Document Revised: 04/03/2020 Document Reviewed: 10/05/2017 Elsevier Patient Education  2022 Elsevier Inc.  

## 2020-12-04 LAB — HIV ANTIBODY (ROUTINE TESTING W REFLEX): HIV 1&2 Ab, 4th Generation: NONREACTIVE

## 2020-12-04 LAB — HEPATITIS B SURFACE ANTIGEN: Hepatitis B Surface Ag: NONREACTIVE

## 2020-12-04 LAB — RPR: RPR Ser Ql: NONREACTIVE

## 2020-12-04 LAB — HEPATITIS C ANTIBODY
Hepatitis C Ab: NONREACTIVE
SIGNAL TO CUT-OFF: 0.03 (ref <1.00)

## 2020-12-11 LAB — CERVICOVAGINAL ANCILLARY ONLY
Chlamydia: NEGATIVE
Comment: NEGATIVE
Comment: NEGATIVE
Comment: NORMAL
Neisseria Gonorrhea: NEGATIVE
Trichomonas: NEGATIVE

## 2021-02-03 ENCOUNTER — Other Ambulatory Visit: Payer: Self-pay | Admitting: Obstetrics and Gynecology

## 2021-03-25 ENCOUNTER — Encounter (HOSPITAL_BASED_OUTPATIENT_CLINIC_OR_DEPARTMENT_OTHER): Payer: Self-pay | Admitting: Obstetrics and Gynecology

## 2021-03-25 ENCOUNTER — Other Ambulatory Visit: Payer: Self-pay

## 2021-03-25 NOTE — Progress Notes (Signed)
Spoke w/ via phone for pre-op interview--- Haley [name] Lab needs dos----   RPR T&S and UPT.            Lab results------ COVID test -----patient states asymptomatic no test needed Arrive at -------1115 NPO after MN NO Solid Food.  Clear liquids from MN until---1015 Med rec completed Medications to take morning of surgery -----NONE Diabetic medication ----- Patient instructed no nail polish to be worn day of surgery Patient instructed to bring photo id and insurance card day of surgery Patient aware to have Driver (ride ) / caregiver   Unknown at this time but aware she has to have a driver and stay with someone for 24 hrs.  for 24 hours after surgery  Patient Special Instructions ----- Pre-Op special Istructions ----- Patient verbalized understanding of instructions that were given at this phone interview. Patient denies shortness of breath, chest pain, fever, cough at this phone interview.

## 2021-03-31 ENCOUNTER — Encounter (HOSPITAL_BASED_OUTPATIENT_CLINIC_OR_DEPARTMENT_OTHER): Payer: Self-pay | Admitting: Obstetrics and Gynecology

## 2021-03-31 ENCOUNTER — Ambulatory Visit (HOSPITAL_BASED_OUTPATIENT_CLINIC_OR_DEPARTMENT_OTHER): Payer: 59 | Admitting: Anesthesiology

## 2021-03-31 ENCOUNTER — Ambulatory Visit (HOSPITAL_BASED_OUTPATIENT_CLINIC_OR_DEPARTMENT_OTHER)
Admission: RE | Admit: 2021-03-31 | Discharge: 2021-03-31 | Disposition: A | Discharge status: Home / Self Care | Payer: 59 | Attending: Obstetrics and Gynecology | Admitting: Obstetrics and Gynecology

## 2021-03-31 ENCOUNTER — Encounter (HOSPITAL_BASED_OUTPATIENT_CLINIC_OR_DEPARTMENT_OTHER)
Admission: RE | Disposition: A | Discharge status: Home / Self Care | Payer: Self-pay | Source: Home / Self Care | Attending: Obstetrics and Gynecology

## 2021-03-31 ENCOUNTER — Other Ambulatory Visit: Payer: Self-pay | Admitting: Obstetrics and Gynecology

## 2021-03-31 ENCOUNTER — Other Ambulatory Visit: Payer: Self-pay

## 2021-03-31 DIAGNOSIS — N946 Dysmenorrhea, unspecified: Secondary | ICD-10-CM | POA: Diagnosis not present

## 2021-03-31 DIAGNOSIS — N736 Female pelvic peritoneal adhesions (postinfective): Secondary | ICD-10-CM | POA: Diagnosis not present

## 2021-03-31 DIAGNOSIS — N971 Female infertility of tubal origin: Secondary | ICD-10-CM

## 2021-03-31 DIAGNOSIS — N920 Excessive and frequent menstruation with regular cycle: Secondary | ICD-10-CM | POA: Diagnosis not present

## 2021-03-31 DIAGNOSIS — D252 Subserosal leiomyoma of uterus: Secondary | ICD-10-CM | POA: Diagnosis not present

## 2021-03-31 DIAGNOSIS — D251 Intramural leiomyoma of uterus: Secondary | ICD-10-CM | POA: Insufficient documentation

## 2021-03-31 HISTORY — PX: HYSTEROSCOPY: SHX211

## 2021-03-31 HISTORY — PX: LAPAROSCOPIC LYSIS OF ADHESIONS: SHX5905

## 2021-03-31 HISTORY — DX: Endometriosis, unspecified: N80.9

## 2021-03-31 LAB — TYPE AND SCREEN
ABO/RH(D): O POS
Antibody Screen: NEGATIVE

## 2021-03-31 LAB — POCT PREGNANCY, URINE: Preg Test, Ur: NEGATIVE

## 2021-03-31 SURGERY — LYSIS, ADHESIONS, LAPAROSCOPIC
Anesthesia: General | Site: Uterus

## 2021-03-31 MED ORDER — LIDOCAINE 2% (20 MG/ML) 5 ML SYRINGE
INTRAMUSCULAR | Status: DC | PRN
Start: 2021-03-31 — End: 2021-03-31
  Administered 2021-03-31: 40 mg via INTRAVENOUS

## 2021-03-31 MED ORDER — MIDAZOLAM HCL 2 MG/2ML IJ SOLN
INTRAMUSCULAR | Status: AC
  Filled 2021-03-31: qty 2

## 2021-03-31 MED ORDER — LIDOCAINE HCL (PF) 2 % IJ SOLN
INTRAMUSCULAR | Status: AC
  Filled 2021-03-31: qty 5

## 2021-03-31 MED ORDER — PROPOFOL 10 MG/ML IV BOLUS
INTRAVENOUS | Status: DC | PRN
  Administered 2021-03-31: 120 mg via INTRAVENOUS

## 2021-03-31 MED ORDER — METHYLENE BLUE 0.5 % INJ SOLN
INTRAVENOUS | Status: DC | PRN
  Administered 2021-03-31: 20 mL

## 2021-03-31 MED ORDER — FENTANYL CITRATE (PF) 100 MCG/2ML IJ SOLN
INTRAMUSCULAR | Status: DC | PRN
  Administered 2021-03-31: 25 ug via INTRAVENOUS
  Administered 2021-03-31: 50 ug via INTRAVENOUS
  Administered 2021-03-31 (×3): 25 ug via INTRAVENOUS
  Administered 2021-03-31: 50 ug via INTRAVENOUS

## 2021-03-31 MED ORDER — CELECOXIB 200 MG PO CAPS
200.0000 mg | ORAL_CAPSULE | Freq: Once | ORAL | Status: AC
  Administered 2021-03-31: 200 mg via ORAL

## 2021-03-31 MED ORDER — SCOPOLAMINE 1 MG/3DAYS TD PT72
MEDICATED_PATCH | TRANSDERMAL | Status: AC
  Filled 2021-03-31: qty 1

## 2021-03-31 MED ORDER — ACETAMINOPHEN 500 MG PO TABS
ORAL_TABLET | ORAL | Status: AC
  Filled 2021-03-31: qty 2

## 2021-03-31 MED ORDER — FENTANYL CITRATE (PF) 100 MCG/2ML IJ SOLN: 25.0000 ug | INTRAMUSCULAR | Status: DC | PRN

## 2021-03-31 MED ORDER — BUPIVACAINE-EPINEPHRINE 0.25% -1:200000 IJ SOLN
INTRAMUSCULAR | Status: DC | PRN
Start: 2021-03-31 — End: 2021-03-31
  Administered 2021-03-31: 10 mL

## 2021-03-31 MED ORDER — DEXAMETHASONE SODIUM PHOSPHATE 10 MG/ML IJ SOLN
INTRAMUSCULAR | Status: AC
  Filled 2021-03-31: qty 1

## 2021-03-31 MED ORDER — CEFAZOLIN SODIUM-DEXTROSE 2-4 GM/100ML-% IV SOLN
2.0000 g | Freq: Once | INTRAVENOUS | Status: AC
Start: 2021-03-31 — End: 2021-03-31
  Administered 2021-03-31: 2 g via INTRAVENOUS

## 2021-03-31 MED ORDER — FENTANYL CITRATE (PF) 100 MCG/2ML IJ SOLN
INTRAMUSCULAR | Status: AC
  Filled 2021-03-31: qty 2

## 2021-03-31 MED ORDER — CEFAZOLIN SODIUM-DEXTROSE 2-4 GM/100ML-% IV SOLN
INTRAVENOUS | Status: AC
  Filled 2021-03-31: qty 100

## 2021-03-31 MED ORDER — SODIUM CHLORIDE FLUSH 0.9 % IV SOLN
INTRAVENOUS | Status: DC | PRN
  Administered 2021-03-31: 10 mL via INTRAVENOUS

## 2021-03-31 MED ORDER — CELECOXIB 200 MG PO CAPS
ORAL_CAPSULE | ORAL | Status: AC
  Filled 2021-03-31: qty 1

## 2021-03-31 MED ORDER — DEXAMETHASONE SODIUM PHOSPHATE 10 MG/ML IJ SOLN
INTRAMUSCULAR | Status: DC | PRN
  Administered 2021-03-31 (×2): 5 mg via INTRAVENOUS

## 2021-03-31 MED ORDER — ONDANSETRON HCL 4 MG/2ML IJ SOLN
INTRAMUSCULAR | Status: DC | PRN
  Administered 2021-03-31: 4 mg via INTRAVENOUS

## 2021-03-31 MED ORDER — VASOPRESSIN 20 UNIT/ML IV SOLN
INTRAVENOUS | Status: DC | PRN
Start: 2021-03-31 — End: 2021-03-31
  Administered 2021-03-31: 4 [IU]

## 2021-03-31 MED ORDER — ROCURONIUM BROMIDE 10 MG/ML (PF) SYRINGE
PREFILLED_SYRINGE | INTRAVENOUS | Status: DC | PRN
  Administered 2021-03-31: 20 mg via INTRAVENOUS
  Administered 2021-03-31: 40 mg via INTRAVENOUS

## 2021-03-31 MED ORDER — ROCURONIUM BROMIDE 10 MG/ML (PF) SYRINGE
PREFILLED_SYRINGE | INTRAVENOUS | Status: AC
  Filled 2021-03-31: qty 10

## 2021-03-31 MED ORDER — PROMETHAZINE HCL 25 MG/ML IJ SOLN
INTRAMUSCULAR | Status: AC
  Filled 2021-03-31: qty 1

## 2021-03-31 MED ORDER — ACETAMINOPHEN 10 MG/ML IV SOLN: 1000.0000 mg | Freq: Once | INTRAVENOUS | Status: DC | PRN

## 2021-03-31 MED ORDER — 0.9 % SODIUM CHLORIDE (POUR BTL) OPTIME
TOPICAL | Status: DC | PRN
  Administered 2021-03-31: 500 mL

## 2021-03-31 MED ORDER — MIDAZOLAM HCL 2 MG/2ML IJ SOLN
INTRAMUSCULAR | Status: DC | PRN
Start: 2021-03-31 — End: 2021-03-31
  Administered 2021-03-31: 2 mg via INTRAVENOUS

## 2021-03-31 MED ORDER — ONDANSETRON 4 MG PO TBDP: 4.0000 mg | ORAL_TABLET | Freq: Three times a day (TID) | ORAL | 1 refills | Status: AC | PRN

## 2021-03-31 MED ORDER — LACTATED RINGERS IV SOLN: INTRAVENOUS | Status: DC

## 2021-03-31 MED ORDER — POVIDONE-IODINE 10 % EX SWAB: 2.0000 "application " | Freq: Once | CUTANEOUS | Status: DC

## 2021-03-31 MED ORDER — PROPOFOL 10 MG/ML IV BOLUS
INTRAVENOUS | Status: AC
  Filled 2021-03-31: qty 20

## 2021-03-31 MED ORDER — SUGAMMADEX SODIUM 200 MG/2ML IV SOLN
INTRAVENOUS | Status: DC | PRN
Start: 2021-03-31 — End: 2021-03-31
  Administered 2021-03-31: 200 mg via INTRAVENOUS

## 2021-03-31 MED ORDER — PROMETHAZINE HCL 25 MG/ML IJ SOLN
6.2500 mg | INTRAMUSCULAR | Status: DC | PRN
  Administered 2021-03-31: 6.25 mg via INTRAVENOUS

## 2021-03-31 MED ORDER — SODIUM CHLORIDE 0.9 % IR SOLN
Status: DC | PRN
  Administered 2021-03-31: 3000 mL

## 2021-03-31 MED ORDER — ACETAMINOPHEN 500 MG PO TABS
1000.0000 mg | ORAL_TABLET | Freq: Once | ORAL | Status: AC
  Administered 2021-03-31: 1000 mg via ORAL

## 2021-03-31 MED ORDER — SCOPOLAMINE 1 MG/3DAYS TD PT72
1.0000 | MEDICATED_PATCH | TRANSDERMAL | Status: DC
  Administered 2021-03-31: 1.5 mg via TRANSDERMAL

## 2021-03-31 MED ORDER — ONDANSETRON HCL 4 MG/2ML IJ SOLN
INTRAMUSCULAR | Status: AC
  Filled 2021-03-31: qty 2

## 2021-03-31 SURGICAL SUPPLY — 72 items
ADH SKN CLS APL DERMABOND .7 (GAUZE/BANDAGES/DRESSINGS) ×2
BAG RETRIEVAL 10 (BASKET)
BARRIER ADHS 3X4 INTERCEED (GAUZE/BANDAGES/DRESSINGS) IMPLANT
BIPOLAR CUTTING LOOP 21FR (ELECTRODE)
BRR ADH 4X3 ABS CNTRL BYND (GAUZE/BANDAGES/DRESSINGS)
BRR ADH 6X5 SEPRAFILM 1 SHT (MISCELLANEOUS)
CABLE HIGH FREQUENCY MONO STRZ (ELECTRODE) ×3 IMPLANT
CANNULA CURETTE W/SYR 6 (CANNULA) ×1 IMPLANT
CANNULA CURETTE W/SYR 7 (CANNULA) IMPLANT
CATH NOVY CORNUAL CURVED 5.0 (CATHETERS) ×1 IMPLANT
CATH ROBINSON RED A/P 16FR (CATHETERS) ×3 IMPLANT
CATH ROBINSON RED A/P 18FR (CATHETERS) IMPLANT
CATH UROLOGY TORQUE 40 (MISCELLANEOUS) ×1 IMPLANT
CNTNR URN SCR LID CUP LEK RST (MISCELLANEOUS) IMPLANT
CONT SPEC 4OZ STRL OR WHT (MISCELLANEOUS)
COVER MAYO STAND STRL (DRAPES) ×3 IMPLANT
DERMABOND ADVANCED (GAUZE/BANDAGES/DRESSINGS) ×1
DERMABOND ADVANCED .7 DNX12 (GAUZE/BANDAGES/DRESSINGS) IMPLANT
DILATOR CANAL MILEX (MISCELLANEOUS) IMPLANT
DRAPE C-ARM 42X120 X-RAY (DRAPES) IMPLANT
DRSG COVADERM PLUS 2X2 (GAUZE/BANDAGES/DRESSINGS) IMPLANT
DRSG OPSITE POSTOP 3X4 (GAUZE/BANDAGES/DRESSINGS) IMPLANT
DRSG TELFA 3X8 NADH (GAUZE/BANDAGES/DRESSINGS) ×3 IMPLANT
DURAPREP 26ML APPLICATOR (WOUND CARE) ×3 IMPLANT
ELECT BIPOLAR KNIFE NDL PTD 7M (ELECTRODE) IMPLANT
ELECT REM PT RETURN 9FT ADLT (ELECTROSURGICAL) ×3
ELECTRODE REM PT RTRN 9FT ADLT (ELECTROSURGICAL) ×2 IMPLANT
GAUZE 4X4 16PLY ~~LOC~~+RFID DBL (SPONGE) ×6 IMPLANT
GLOVE SURG ENC MOIS LTX SZ8 (GLOVE) ×6 IMPLANT
GOWN STRL REUS W/TWL LRG LVL3 (GOWN DISPOSABLE) ×3 IMPLANT
GOWN STRL REUS W/TWL XL LVL3 (GOWN DISPOSABLE) ×6 IMPLANT
GRASPER SUT TROCAR 14GX15 (MISCELLANEOUS) IMPLANT
IV NS IRRIG 3000ML ARTHROMATIC (IV SOLUTION) ×4 IMPLANT
KIT PROCEDURE FLUENT (KITS) ×3 IMPLANT
KIT TURNOVER CYSTO (KITS) ×3 IMPLANT
LOOP CUTTING BIPOLAR 21FR (ELECTRODE) IMPLANT
MANIPULATOR UTERINE 4.5 ZUMI (MISCELLANEOUS) ×3 IMPLANT
NDL FILTER BLUNT 18X1 1/2 (NEEDLE) IMPLANT
NEEDLE FILTER BLUNT 18X 1/2SAF (NEEDLE) ×1
NEEDLE FILTER BLUNT 18X1 1/2 (NEEDLE) ×2 IMPLANT
NEEDLE INSUFFLATION 120MM (ENDOMECHANICALS) ×3 IMPLANT
PACK LAPAROSCOPY BASIN (CUSTOM PROCEDURE TRAY) ×3 IMPLANT
PACK TRENDGUARD 450 HYBRID PRO (MISCELLANEOUS) ×2 IMPLANT
PACK VAGINAL MINOR WOMEN LF (CUSTOM PROCEDURE TRAY) ×3 IMPLANT
PAD DRESSING TELFA 3X8 NADH (GAUZE/BANDAGES/DRESSINGS) ×2 IMPLANT
PAD OB MATERNITY 4.3X12.25 (PERSONAL CARE ITEMS) ×3 IMPLANT
SEAL ROD LENS SCOPE MYOSURE (ABLATOR) ×3 IMPLANT
SEPRAFILM MEMBRANE 5X6 (MISCELLANEOUS) IMPLANT
SET IRRIG Y TYPE TUR BLADDER L (SET/KITS/TRAYS/PACK) IMPLANT
SET SUCTION IRRIG HYDROSURG (IRRIGATION / IRRIGATOR) ×3 IMPLANT
SET TUBE SMOKE EVAC HIGH FLOW (TUBING) ×3 IMPLANT
SHEARS HARMONIC ACE PLUS 36CM (ENDOMECHANICALS) IMPLANT
STENT BALLN UTERINE 3CM 6FR (STENTS) IMPLANT
STENT BALLN UTERINE 4CM 6FR (STENTS) IMPLANT
SUT MNCRL AB 4-0 PS2 18 (SUTURE) ×3 IMPLANT
SUT VICRYL 0 UR6 27IN ABS (SUTURE) ×3 IMPLANT
SYR 10ML LL (SYRINGE) ×1 IMPLANT
SYR 30ML LL (SYRINGE) ×3 IMPLANT
SYR 50ML LL SCALE MARK (SYRINGE) IMPLANT
SYR 5ML LL (SYRINGE) ×3 IMPLANT
SYR CONTROL 10ML LL (SYRINGE) IMPLANT
SYR TOOMEY IRRIG 70ML (MISCELLANEOUS) ×3
SYRINGE 3CC/18X1.5 ECLIPSE (MISCELLANEOUS) ×3 IMPLANT
SYRINGE TOOMEY IRRIG 70ML (MISCELLANEOUS) ×2 IMPLANT
SYS BAG RETRIEVAL 10MM (BASKET)
SYSTEM BAG RETRIEVAL 10MM (BASKET) IMPLANT
TOWEL OR 17X26 10 PK STRL BLUE (TOWEL DISPOSABLE) ×6 IMPLANT
TRAY FOLEY W/BAG SLVR 14FR LF (SET/KITS/TRAYS/PACK) ×3 IMPLANT
TRENDGUARD 450 HYBRID PRO PACK (MISCELLANEOUS) ×3
TROCAR OPTI TIP 5M 100M (ENDOMECHANICALS) ×7 IMPLANT
TROCAR XCEL DIL TIP R 11M (ENDOMECHANICALS) IMPLANT
WARMER LAPAROSCOPE (MISCELLANEOUS) ×3 IMPLANT

## 2021-03-31 NOTE — Discharge Instructions (Addendum)
NO TYLENOL PRODUCTS UNTIL AFTER 6:30 PM TONIGHT.    DISCHARGE INSTRUCTIONS: Laparoscopy  The following instructions have been prepared to help you care for yourself upon your return home today.  Wound care:  Do not get the incision wet for the first 24 hours. The incision should be kept clean and dry.  The Band-Aids or dressings may be removed the day after surgery.  Should the incision become sore, red, and swollen after the first week, check with your doctor.  Personal hygiene:  Shower the day after your procedure.  Activity and limitations:  Do NOT drive or operate any equipment today.  Do NOT lift anything more than 15 pounds for 2-3 weeks after surgery.  Do NOT rest in bed all day.  Walking is encouraged. Walk each day, starting slowly with 5-minute walks 3 or 4 times a day. Slowly increase the length of your walks.  Walk up and down stairs slowly.  Do NOT do strenuous activities, such as golfing, playing tennis, bowling, running, biking, weight lifting, gardening, mowing, or vacuuming for 2-4 weeks. Ask your doctor when it is okay to start.  Diet: Eat a light meal as desired this evening. You may resume your usual diet tomorrow.  Return to work: This is dependent on the type of work you do. For the most part you can return to a desk job within a week of surgery. If you are more active at work, please discuss this with your doctor.  What to expect after your surgery: You may have a slight burning sensation when you urinate on the first day. You may have a very small amount of blood in the urine. Expect to have a small amount of vaginal discharge/light bleeding for 1-2 weeks. It is not unusual to have abdominal soreness and bruising for up to 2 weeks. You may be tired and need more rest for about 1 week. You may experience shoulder pain for 24-72 hours. Lying flat in bed may relieve it.  Call your doctor for any of the following:  Develop a fever of 100.4 or greater  Inability to  urinate 6 hours after discharge from hospital  Severe pain not relieved by pain medications  Persistent of heavy bleeding at incision site  Redness or swelling around incision site after a week  Increasing nausea or vomiting    Post Anesthesia Home Care Instructions  Activity: Get plenty of rest for the remainder of the day. A responsible individual must stay with you for 24 hours following the procedure.  For the next 24 hours, DO NOT: -Drive a car -Paediatric nurse -Drink alcoholic beverages -Take any medication unless instructed by your physician -Make any legal decisions or sign important papers.  Meals: Start with liquid foods such as gelatin or soup. Progress to regular foods as tolerated. Avoid greasy, spicy, heavy foods. If nausea and/or vomiting occur, drink only clear liquids until the nausea and/or vomiting subsides. Call your physician if vomiting continues.  Special Instructions/Symptoms: Your throat may feel dry or sore from the anesthesia or the breathing tube placed in your throat during surgery. If this causes discomfort, gargle with warm salt water. The discomfort should disappear within 24 hours.  If you had a scopolamine patch placed behind your ear for the management of post- operative nausea and/or vomiting:  1. The medication in the patch is effective for 72 hours, after which it should be removed.  Wrap patch in a tissue and discard in the trash. Wash hands thoroughly with  soap and water. 2. You may remove the patch earlier than 72 hours if you experience unpleasant side effects which may include dry mouth, dizziness or visual disturbances. 3. Avoid touching the patch. Wash your hands with soap and water after contact with the patch.

## 2021-03-31 NOTE — Anesthesia Preprocedure Evaluation (Addendum)
Anesthesia Evaluation  Patient identified by MRN, date of birth, ID band  Reviewed: Allergy & Precautions, NPO status , Patient's Chart, lab work & pertinent test results  Airway Mallampati: II  TM Distance: >3 FB Neck ROM: Full    Dental no notable dental hx. (+) Dental Advisory Given, Teeth Intact   Pulmonary neg pulmonary ROS,    Pulmonary exam normal breath sounds clear to auscultation       Cardiovascular negative cardio ROS Normal cardiovascular exam Rhythm:Regular Rate:Normal     Neuro/Psych negative neurological ROS  negative psych ROS   GI/Hepatic negative GI ROS, Neg liver ROS,   Endo/Other  negative endocrine ROS  Renal/GU negative Renal ROS  Female GU complaint Fallopian tube adhesions    Musculoskeletal negative musculoskeletal ROS (+)   Abdominal   Peds  Hematology negative hematology ROS (+)   Anesthesia Other Findings   Reproductive/Obstetrics negative OB ROS                            Anesthesia Physical Anesthesia Plan  ASA: 2  Anesthesia Plan: General   Post-op Pain Management: Tylenol PO (pre-op)* and Celebrex PO (pre-op)*   Induction: Intravenous  PONV Risk Score and Plan: 3 and Ondansetron, Dexamethasone, Midazolam, Scopolamine patch - Pre-op and Treatment may vary due to age or medical condition  Airway Management Planned: Mask and Oral ETT  Additional Equipment: None  Intra-op Plan:   Post-operative Plan: Extubation in OR  Informed Consent: I have reviewed the patients History and Physical, chart, labs and discussed the procedure including the risks, benefits and alternatives for the proposed anesthesia with the patient or authorized representative who has indicated his/her understanding and acceptance.     Dental advisory given  Plan Discussed with: CRNA  Anesthesia Plan Comments:        Anesthesia Quick Evaluation

## 2021-03-31 NOTE — Anesthesia Postprocedure Evaluation (Signed)
Anesthesia Post Note  Patient: Haley [name]  Procedure(s) Performed: LAPAROSCOPIC LYSIS OF ADHESIONS/excision of endometriosis (Abdomen) INTEROPERATIVE HYSTEROSCOPY WITH attempted RE-CATHETERIZATION OF LEFT TUBE USING NOVY CATHETERsuction /D&C/chromotubation (Uterus)     Patient location during evaluation: PACU Anesthesia Type: General Level of consciousness: awake and alert Pain management: pain level controlled Vital Signs Assessment: post-procedure vital signs reviewed and stable Respiratory status: spontaneous breathing, nonlabored ventilation, respiratory function stable and patient connected to nasal cannula oxygen Cardiovascular status: blood pressure returned to baseline and stable Postop Assessment: no apparent nausea or vomiting Anesthetic complications: no   No notable events documented.  Last Vitals:  Vitals:   03/31/21 1630 03/31/21 1645  BP: (!) 132/97 121/72  Pulse:  62  Resp: 11 (!) 9  Temp:    SpO2:  100%    Last Pain:  Vitals:   03/31/21 1619  TempSrc:   PainSc: 0-No pain                 Omran Keelin S

## 2021-03-31 NOTE — Transfer of Care (Signed)
Immediate Anesthesia Transfer of Care Note  Patient: Haley [name]  Procedure(s) Performed: Procedure(s) (LRB): LAPAROSCOPIC LYSIS OF ADHESIONS/excision of endometriosis (N/A) INTEROPERATIVE HYSTEROSCOPY WITH attempted RE-CATHETERIZATION OF LEFT TUBE USING NOVY CATHETERsuction /D&C/chromotubation (N/A)  Patient Location: PACU  Anesthesia Type: General  Level of Consciousness: awake, oriented, sedated and patient cooperative  Airway & Oxygen Therapy: Patient Spontanous Breathing and Patient connected to face mask oxygen  Post-op Assessment: Report given to PACU RN and Post -op Vital signs reviewed and stable  Post vital signs: Reviewed and stable  Complications: No apparent anesthesia complications Last Vitals:  Vitals Value Taken Time  BP 134/61 03/31/21 1619  Temp    Pulse 88 03/31/21 1622  Resp 13 03/31/21 1622  SpO2 100 % 03/31/21 1622  Vitals shown include unvalidated device data.  Last Pain:  Vitals:   03/31/21 1132  TempSrc: Oral  PainSc: 0-No pain      Patients Stated Pain Goal: 6 (99/83/38 2505)  Complications: No notable events documented.

## 2021-03-31 NOTE — H&P (Addendum)
Haley [name] is a 36 y.o. female , originally referred to me by Dr. Harolyn Rutherford, for infertility.  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  Patient would like to preserve her childbearing potential.  Pertinent Gynecological History: Menses: flow is excessive with use of 3 pads or tampons on heaviest days Bleeding: dysfunctional uterine bleeding Contraception: none DES exposure: denies Blood transfusions: none Sexually transmitted diseases: no past history Previous GYN Procedures:  Last pap: normal  OB History:   Menstrual History: Menarche age: 89 No LMP recorded.    Past Medical History:  Diagnosis Date   Fibrocystic breast, right 10/11/2016   Irregular heart beat    Premature ventricular contractions 03/2014                    Past Surgical History:  Procedure Laterality Date   WISDOM TOOTH EXTRACTION               Family History  Problem Relation Age of Onset   Asthma Mother    Diabetes Father    No hereditary disease.  No cancer of breast, ovary, uterus. No cutaneous leiomyomatosis or renal cell carcinoma.  Social History   Socioeconomic History   Marital status: Divorced    Spouse name: Not on file   Number of children: Not on file   Years of education: Not on file   Highest education level: Not on file  Occupational History   Not on file  Tobacco Use   Smoking status: Never   Smokeless tobacco: Never  Vaping Use   Vaping Use: Never used  Substance and Sexual Activity   Alcohol use: No   Drug use: No   Sexual activity: Yes    Partners: Male    Birth control/protection: Rhythm  Other Topics Concern   Not on file  Social History Narrative   Not on file   Social Determinants of Health   Financial Resource Strain: Not on file  Food Insecurity: Not on file  Transportation Needs: Not on file  Physical Activity: Not on file  Stress: Not on file  Social Connections: Not on file  Intimate Partner Violence:  Not on file    Allergies  Allergen Reactions   Corylus Nausea And Vomiting   Latex Hives   Penicillins Hives    No current facility-administered medications on file prior to encounter.   Current Outpatient Medications on File Prior to Encounter  Medication Sig Dispense Refill   b complex vitamins capsule Take 1 capsule by mouth daily.     Prenatal Vit-Fe Fumarate-FA (PRENATAL VITAMIN PO) Take by mouth.     Probiotic Product (PROBIOTIC-10 PO) Take by mouth.       Review of Systems  Constitutional: Negative.   HENT: Negative.   Eyes: Negative.   Respiratory: Negative.   Cardiovascular: Negative.   Gastrointestinal: Negative.   Genitourinary: Negative.   Musculoskeletal: Negative.   Skin: Negative.   Neurological: Negative.   Endo/Heme/Allergies: Negative.   Psychiatric/Behavioral: Negative.      Physical Exam  BP 127/89    Pulse 93    Temp 98.7 F (37.1 C) (Oral)    Resp 15    Ht 5\' 1"  (1.549 m)    Wt 52.7 kg    LMP 03/10/2021 (Exact Date)    SpO2 100%    BMI 21.96 kg/m  Constitutional: She is oriented to person, place, and time. She appears well-developed and well-nourished.  HENT:  Head:  Normocephalic and atraumatic.  Nose: Nose normal.  Mouth/Throat: Oropharynx is clear and moist. No oropharyngeal exudate.  Eyes: Conjunctivae normal and EOM are normal. Pupils are equal, round, and reactive to light. No scleral icterus.  Neck: Normal range of motion. Neck supple. No tracheal deviation present. No thyromegaly present.  Cardiovascular: Normal rate.   Respiratory: Effort normal and breath sounds normal.  GI: Soft. Bowel sounds are normal. She exhibits no distension and no mass. There is no tenderness.  Lymphadenopathy:    She has no cervical adenopathy.  Neurological: She is alert and oriented to person, place, and time. She has normal reflexes.  Skin: Skin is warm.  Psychiatric: She has a normal mood and affect. Her behavior is normal. Judgment and thought content  normal.       Assessment/Plan:  Intramural and subserosal uterine myomas, causing menorrhagia and pressure sensation. Preoperative for laparoscopy, lysis of adhesions, possible tuboplasty, hysteroscopy, left proximal tubal recanalization under fluoroscopic guidance.  Benefits and risks of the proposed procedures were discussed with the patient and her family member again.  Bowel prep instructions were given.  All of patient's questions were answered.  She verbalized understanding.

## 2021-03-31 NOTE — Anesthesia Procedure Notes (Signed)
Procedure Name: Intubation Date/Time: 03/31/2021 2:17 PM Performed by: Suan Halter, CRNA Pre-anesthesia Checklist: Patient identified, Emergency Drugs available, Suction available and Patient being monitored Patient Re-evaluated:Patient Re-evaluated prior to induction Oxygen Delivery Method: Circle system utilized Preoxygenation: Pre-oxygenation with 100% oxygen Induction Type: IV induction Ventilation: Mask ventilation without difficulty Laryngoscope Size: Mac and 3 Grade View: Grade I Tube type: Oral Tube size: 7.0 mm Number of attempts: 1 Airway Equipment and Method: Stylet and Oral airway Placement Confirmation: ETT inserted through vocal cords under direct vision, positive ETCO2 and breath sounds checked- equal and bilateral Secured at: 21 cm Tube secured with: Tape Dental Injury: Teeth and Oropharynx as per pre-operative assessment

## 2021-03-31 NOTE — Op Note (Addendum)
Operative Note  Preoperative diagnosis: Dysmenorrhea, left proximal tubal occlusion, infertility, rule out endometriosis  Postoperative diagnosis: Dysmenorrhea, left proximal tubal occlusion, infertility, probable endometriosis, stage I (pathology pending)  Procedure: Laparoscopy, chromopertubation, electrosurgical excision of peritoneal lesions, hysteroscopy, suction D&C, bilateral Novy catheterization of fallopian tubes (attempted)  Anesthesia: Gen. endotracheal  Complications: None  Estimated blood loss: <10 cc  Specimens: Posterior cul-de-sac biopsy and endometrial curettings to pathology  Findings: On exam under anesthesia, external genitalia, Bartholin's, Skene's, urethra and vagina were normal.  The cervix was somewhat scarred but grossly normal.  Corpus was retroflexed, mobile, normal-sized.  There were no posterior fornix nodularities.  There were no adnexal masses palpable.   On laparoscopy, upper abdomen, liver surface and diaphragm surfaces were normal. Gallbladder was not visualized.  The appendix appeared normal.  The pelvic peritoneum looked mostly normal, except for a 2 x 2 centimeter area of nonpigmented stellate lesions in the posterior cul-de-sac to the right of midline.  This entire area was excised after hydrodissection. Both tubes appeared normal proximally and distally with fimbria rated at 4/5.  The right tube was patent to chromotubation although the left tube because the swelling at the left uterotubal junction without passing the methylene blue.  Despite multiple attempts to recanalize the left proximal tube we were unable to do so with the guidewire. Both ovaries appeared grossly normal. Anterior cul-de-sac and the uterus appeared normal.  On hysteroscopy, endocervical canal was normal.  At the internal os there was a cervical polyp that was excised during the suction D&C.  It was difficult to visualize the endometrial cavity because of the lush endometrium and the  retroflexion of the uterus.  Therefore suction D&C was performed but even after the suction D&C, we were not clearly visualized the tubal ostia.  We were only able to see the ostial regions.  Description of the procedure: The patient was placed in dorsal supine position and general endotracheal anesthesia was given. 2 g of cefazolin were given intravenously for prophylaxis. Patient was placed in lithotomy position. She was prepped and draped inside manner.  A Foley catheter was inserted into the bladder. The surgeon was regloved and a surgical field was created on the abdomen.  After preemptive anesthesia of all surgical sites with 0.25% bupivacaine with 1 200,000 epinephrine, a 5 mm intraumbilical skin incision was made and a Verress needle was inserted. Its correct location was confirmed. A pneumoperitoneum was created with carbon dioxide.  5 mm laparoscope with a 30 lens was inserted and video laparoscopy was started . A left lower quadrant 5 mm and a right lower quadrant  5 mm incisions were made and ancillary trochars were placed under direct visualization. Above findings were noted.   Using a needle electrode on 58 W of cutting current, hydrodissection was performed in the posterior cul-de-sac peritoneum and the nonpigmented endometriosis lesion area was carefully excised without harboring surrounding organs. Using a dual video system, the surgeon then took a seat between the patient's legs for a hysteroscopy and dilute vasopressin was injected into the cervix and the lower cervical lip was grasped with a tenaculum.  A slender hysteroscope with 30 degree lens was inserted into the cervix.  Distention medium was saline.  Distention method was a dedicated pump set at 100 mmHg pressure.  Video hysteroscopy was started.  Above findings were noted. The hysteroscope was taken out and suction D&C was performed using a 6 mm curette attached to a manual evacuation device.  Upon reinserting the hysteroscope  the  tubal ostia were still not clearly visible.  We inserted a Novy catheter into the left ostial region and try to pass a cornual access catheter with a 0.018 Glidewire in it.  We met resistance and could not proceed any further.  Same steps were repeated to cannulate the right proximal tube and similar resistance was encountered. At this time we inserted a ZUMI catheter into the uterus and chromotubation was performed.  Laparoscopy revealed right tubal patency but the methylene blue caused the swelling in the left uterotubal junction without further passage of the dye. The procedure was terminated the hysteroscope was removed.  The pelvis was copiously irrigated aspirated and hemostasis was checked.  Instrument count and lap pad count were correct x2.  The gas was allowed to escape.  It 0 Vicryl suture was placed on the fascia in the intraumbilical incision.  Rest of the skin incisions were approximated with 4-0 Monocryl in subcuticular stitches.  Dermabond was applied to the skin.  The patient tolerated the procedure well and was transferred to recovery room in satisfactory condition.    Governor Specking, MD

## 2021-04-01 ENCOUNTER — Encounter (HOSPITAL_BASED_OUTPATIENT_CLINIC_OR_DEPARTMENT_OTHER): Payer: Self-pay | Admitting: Obstetrics and Gynecology

## 2021-04-02 LAB — SURGICAL PATHOLOGY

## 2021-05-03 ENCOUNTER — Ambulatory Visit: Payer: 59 | Admitting: Obstetrics and Gynecology

## 2021-05-03 NOTE — Progress Notes (Deleted)
? ?  GYNECOLOGY OFFICE VISIT NOTE ? ?History:  ? Haley [name] is a 36 y.o. [id number] here today for vaginal discharge and odor since 3/21. Review of prior cultures shows sometimes positive for BV.  ? ?She denies any abnormal vaginal discharge, bleeding, pelvic pain or other concerns. ? ? ?  ?Past Medical History:  ?Diagnosis Date  ? Endometriosis   ? Fibrocystic breast, right 10/11/2016  ? Irregular heart beat   ? Premature ventricular contractions 03/2014  ? ? ?Past Surgical History:  ?Procedure Laterality Date  ? HYSTEROSCOPY N/A 03/31/2021  ? Procedure: INTEROPERATIVE HYSTEROSCOPY WITH attempted RE-CATHETERIZATION OF LEFT TUBE USING NOVY CATHETERsuction /D&C/chromotubation;  Surgeon: Governor Specking, MD;  Location: [facility];  Service: Gynecology;  Laterality: N/A;  ? LAPAROSCOPIC LYSIS OF ADHESIONS N/A 03/31/2021  ? Procedure: LAPAROSCOPIC LYSIS OF ADHESIONS/excision of endometriosis;  Surgeon: Governor Specking, MD;  Location: [facility];  Service: Gynecology;  Laterality: N/A;  ? WISDOM TOOTH EXTRACTION    ? ? ?The following portions of the patient's history were reviewed and updated as appropriate: allergies, current medications, past family history, past medical history, past social history, past surgical history and problem list.  ? ?Health Maintenance:   ?Normal pap and negative HRHPV on 11/2019.   ?Diagnosis  ?Date Value Ref Range Status  ?11/26/2019   Final  ? - Negative for intraepithelial lesion or malignancy (NILM)  ?  ? ?Review of Systems:  ?Pertinent items noted in HPI and remainder of comprehensive ROS otherwise negative. ? ?Physical Exam:  ?There were no vitals taken for this visit. ?CONSTITUTIONAL: Well-developed, well-nourished female in no acute distress.  ?HEENT:  Normocephalic, atraumatic. External right and left ear normal. No scleral icterus.  ?NECK: Normal range of motion, supple, no masses noted on observation ?SKIN: No rash noted. Not diaphoretic. No  erythema. No pallor. ?MUSCULOSKELETAL: Normal range of motion. No edema noted. ?NEUROLOGIC: Alert and oriented to person, place, and time. Normal muscle tone coordination. No cranial nerve deficit noted. ?PSYCHIATRIC: Normal mood and affect. Normal behavior. Normal judgment and thought content. ? ?CARDIOVASCULAR: Normal heart rate noted ?RESPIRATORY: Effort and breath sounds normal, no problems with respiration noted ?ABDOMEN: No masses noted. No other overt distention noted.   ? ?PELVIC: Normal appearing external genitalia; normal urethral meatus; normal appearing vaginal mucosa and cervix.  No abnormal discharge noted.  Normal uterine size, no other palpable masses, no uterine or adnexal tenderness. Performed in the presence of a chaperone ? ?Labs and Imaging ?No results found for this or any previous visit (from the past 168 hour(s)). ?No results found.  ?Assessment and Plan:  ? 1. Vaginal discharge ?*** ? ? ? ?Diagnoses and all orders for this visit: ? ?Vaginal discharge ? ? ? ?Routine preventative health maintenance measures emphasized. ?Please refer to After Visit Summary for other counseling recommendations.  ? ?No follow-ups on file. ? ?Radene Gunning, MD, FACOG ?Obstetrician Social research officer, government, Faculty Practice ?[facility] ? ? ? ? ? ?

## 2022-01-20 ENCOUNTER — Encounter: Payer: Self-pay | Admitting: Obstetrics and Gynecology

## 2022-01-20 ENCOUNTER — Ambulatory Visit (INDEPENDENT_AMBULATORY_CARE_PROVIDER_SITE_OTHER): Payer: Self-pay | Admitting: Obstetrics and Gynecology

## 2022-01-20 VITALS — BP 122/81 | HR 69 | Ht 60.0 in | Wt 115.0 lb

## 2022-01-20 DIAGNOSIS — N63 Unspecified lump in unspecified breast: Secondary | ICD-10-CM

## 2022-01-20 DIAGNOSIS — N6452 Nipple discharge: Secondary | ICD-10-CM

## 2022-01-20 NOTE — Progress Notes (Signed)
   RETURN GYNECOLOGY VISIT  Subjective:  Haley [name] is a 36 y.o. [id number] with LMP 01/14/2022 presenting for new breast lump  Reports new lump in L breast that she felt with her last menstrual cycle. Waited to see if it would resolve w/ menstrual cycle, but now present x 2 full cycles. Lump is tender.  Has been eval'd for fibrocystic changes in right breast, but this feels different. Associated w/ unilateral light brown nipple discharge.   Past Medical History:  Diagnosis Date   Endometriosis    Fibrocystic breast, right 10/11/2016   Irregular heart beat    Premature ventricular contractions 03/2014   Past Surgical History:  Procedure Laterality Date   HYSTEROSCOPY N/A 03/31/2021   Procedure: INTEROPERATIVE HYSTEROSCOPY WITH attempted RE-CATHETERIZATION OF LEFT TUBE USING NOVY CATHETERsuction /D&C/chromotubation;  Surgeon: Governor Specking, MD;  Location: [facility];  Service: Gynecology;  Laterality: N/A;   LAPAROSCOPIC LYSIS OF ADHESIONS N/A 03/31/2021   Procedure: LAPAROSCOPIC LYSIS OF ADHESIONS/excision of endometriosis;  Surgeon: Governor Specking, MD;  Location: [facility];  Service: Gynecology;  Laterality: N/A;   WISDOM TOOTH EXTRACTION     Current Outpatient Medications on File Prior to Visit  Medication Sig Dispense Refill   b complex vitamins capsule Take 1 capsule by mouth daily. (Patient not taking: Reported on 01/20/2022)     ondansetron (ZOFRAN-ODT) 4 MG disintegrating tablet Take 1 tablet (4 mg total) by mouth every 8 (eight) hours as needed for nausea or vomiting. (Patient not taking: Reported on 01/20/2022) 12 tablet 1   Prenatal Vit-Fe Fumarate-FA (PRENATAL VITAMIN PO) Take by mouth. (Patient not taking: Reported on 01/20/2022)     Probiotic Product (PROBIOTIC-10 PO) Take by mouth. (Patient not taking: Reported on 01/20/2022)     No current facility-administered medications on file prior to visit.   Last pap NILM/HPV neg  11/26/19  Objective:   Vitals:   01/20/22 0831  BP: 122/81  Pulse: 69  Weight: 115 lb (52.2 kg)  Height: 5' (1.524 m)   General:  Alert, oriented and cooperative. Patient is in no acute distress.  Skin: Skin is warm and dry. No rash noted.   Cardiovascular: Normal heart rate noted  Respiratory: Normal respiratory effort, no problems with respiration noted  Breast:  Left breast with 2cm mildly tender smooth, mobile mass at 2-3 o'clock approximately 3cm from the nipple, no overlying erythema, no nipple discharge expressed Right breast with no masses, lesions or skin changes   Assessment and Plan:  Haley [name] is a 36 y.o. with breast lump and unilateral nipple discharge x 2 months  1. Palpable mass of breast and unilateral nipple discharge - US BREAST LTD UNI LEFT INC AXILLA; Future - MM DIAG BREAST TOMO BILATERAL; Future  Follow up to be determined by imaging results  Future Appointments  Date Time Provider [place]  02/08/2022 11:10 AM [facility] DIAG TOMO 2 [facility] CE  02/08/2022 11:20 AM [facility] [place] 2 [facility] CE  02/16/2022  1:30 PM Constant, Vickii Chafe, MD [id number]   Inez Catalina, MD

## 2022-02-08 ENCOUNTER — Ambulatory Visit
Admission: RE | Admit: 2022-02-08 | Discharge: 2022-02-08 | Disposition: A | Discharge status: Home / Self Care | Payer: Self-pay | Source: Ambulatory Visit | Attending: Obstetrics and Gynecology | Admitting: Obstetrics and Gynecology

## 2022-02-08 ENCOUNTER — Ambulatory Visit
Admission: RE | Admit: 2022-02-08 | Discharge: 2022-02-08 | Disposition: A | Discharge status: Home / Self Care | Payer: No Typology Code available for payment source | Source: Ambulatory Visit | Attending: Obstetrics and Gynecology | Admitting: Obstetrics and Gynecology

## 2022-02-08 ENCOUNTER — Other Ambulatory Visit: Payer: Self-pay | Admitting: Obstetrics and Gynecology

## 2022-02-08 DIAGNOSIS — N63 Unspecified lump in unspecified breast: Secondary | ICD-10-CM

## 2022-02-08 DIAGNOSIS — N6320 Unspecified lump in the left breast, unspecified quadrant: Secondary | ICD-10-CM

## 2022-02-10 ENCOUNTER — Ambulatory Visit: Payer: Self-pay | Admitting: Hematology and Oncology

## 2022-02-10 VITALS — BP 113/76 | Wt 117.0 lb

## 2022-02-10 DIAGNOSIS — N6320 Unspecified lump in the left breast, unspecified quadrant: Secondary | ICD-10-CM

## 2022-02-10 NOTE — Progress Notes (Signed)
Ms. Haley [name] is a 37 y.o. female who presents to [facility] clinic today with complaint of left breast mass.    Pap Smear: Pap not smear completed today. Last Pap smear was 2021 and was normal. Per patient has no history of an abnormal Pap smear. Last Pap smear result is available in Epic.   Physical exam: Breasts Breasts symmetrical. No skin abnormalities bilateral breasts. No nipple retraction bilateral breasts. No nipple discharge bilateral breasts. No lymphadenopathy. No lumps palpated bilateral breasts.     MM DIAG BREAST TOMO BILATERAL  Result Date: 02/08/2022 CLINICAL DATA:  Tender palpable mass in the LEFT breast for 2 months. Mass is larger during menstrual cycle. EXAM: DIGITAL DIAGNOSTIC BILATERAL MAMMOGRAM WITH TOMOSYNTHESIS; ULTRASOUND LEFT BREAST LIMITED TECHNIQUE: Bilateral digital diagnostic mammography and breast tomosynthesis was performed.; Targeted ultrasound examination of the left breast was performed. COMPARISON:  None available. ACR Breast Density Category c: The breast tissue is heterogeneously dense, which may obscure small masses. FINDINGS: RIGHT breast is negative. Within the UPPER-OUTER QUADRANT of the LEFT breast, there is a partially obscured oval mass marked as palpable with BB. Calcifications in the MEDIAL portion of the LEFT breast are further evaluated with magnified views. These views demonstrate a tubular structure associated with layering calcifications. On physical exam, I palpate a discrete mobile oval mass in the 2 o'clock location of the LEFT breast corresponding to the area of concern. Targeted ultrasound is performed, showing a simple cyst in the 2 o'clock location of the LEFT breast 5 centimeters from the nipple. Cyst is 2.1 x 1.2 x 2.1 centimeters. In the 10 o'clock location 3 centimeters from the nipple, there is a dilated duct system associated with numerous small cysts. At real-time exam, intraductal calcifications are present. No intraductal mass  identified. Evaluation of the LEFT axilla is negative for adenopathy. IMPRESSION: 1. Tender palpable cyst in the 2 o'clock location of the LEFT breast. We discussed the option ultrasound-guided cyst aspiration if needed. 2. Indeterminate duct ectasia associated with calcifications in the 10 o'clock location of the LEFT breast. 3. No LEFT axillary adenopathy. RECOMMENDATION: 1. Consider ultrasound-guided cyst aspiration for pain if needed. 2. Recommend ultrasound-guided core biopsy of ectatic duct in the 10 o'clock location, targeting a portion with visible calcifications. I have discussed the findings and recommendations with the patient. If applicable, a reminder letter will be sent to the patient regarding the next appointment. BI-RADS CATEGORY  4: Suspicious. Electronically Signed   By: Nolon Nations M.D.   On: 02/08/2022 12:23     Pelvic/Bimanual Pap is not indicated today    Smoking History: Patient has never smoked and was not referred to quit line.    Patient Navigation: Patient education provided. Access to services provided for patient through [facility] program. No interpreter provided. No transportation provided   Colorectal Cancer Screening: Per patient has never had colonoscopy completed No complaints today.    Breast and Cervical Cancer Risk Assessment: Patient does not have family history of breast cancer, known genetic mutations, or radiation treatment to the chest before age 40. Patient does not have history of cervical dysplasia, immunocompromised, or DES exposure in-utero.  Risk Assessment   No risk assessment data     A: [facility] exam without pap smear Complaint of left breast mass noted at 2 o'clock and 10 o'clock. These were identified on exam and patient is already scheduled for biopsy 02/15/2022.  P: Referred patient to the [facility] for a  biopsy . Appointment scheduled  02/15/2022.  Melodye Ped, NP 02/10/2022 11:41 AM

## 2022-02-10 NOTE — Patient Instructions (Signed)
[place] about BSE and gave educational materials to take home. Patient did not need a Pap smear today due to last Pap smear was in 2021 per patient. Next Pap due 2026. Let her know [facility] will cover Pap smears every 5 years unless has a history of abnormal Pap smears. Referred patient to the [facility] for diagnostic mammogram. Appointment scheduled for 02/15/2022. Patient aware of appointment and will be there. Let patient know will follow up with her within the next couple weeks with results. Renette Butters verbalized understanding.  Melodye Ped, NP 12:03 PM

## 2022-02-15 ENCOUNTER — Ambulatory Visit
Admission: RE | Admit: 2022-02-15 | Discharge: 2022-02-15 | Disposition: A | Discharge status: Home / Self Care | Payer: No Typology Code available for payment source | Source: Ambulatory Visit | Attending: Obstetrics and Gynecology | Admitting: Obstetrics and Gynecology

## 2022-02-15 ENCOUNTER — Other Ambulatory Visit: Payer: Self-pay | Admitting: Obstetrics and Gynecology

## 2022-02-15 DIAGNOSIS — N6320 Unspecified lump in the left breast, unspecified quadrant: Secondary | ICD-10-CM

## 2022-02-15 HISTORY — PX: BREAST BIOPSY: SHX20

## 2022-02-16 ENCOUNTER — Encounter: Payer: Self-pay | Admitting: Obstetrics and Gynecology

## 2022-02-16 ENCOUNTER — Ambulatory Visit (INDEPENDENT_AMBULATORY_CARE_PROVIDER_SITE_OTHER): Payer: Self-pay | Admitting: Obstetrics and Gynecology

## 2022-02-16 VITALS — BP 122/84 | HR 50 | Ht 60.0 in | Wt 116.0 lb

## 2022-02-16 DIAGNOSIS — Z01419 Encounter for gynecological examination (general) (routine) without abnormal findings: Secondary | ICD-10-CM

## 2022-02-16 NOTE — Progress Notes (Signed)
Subjective:     Haley [name] is a 37 y.o. female P2 with LMP 02/09/2021 and BMI 22 who is here for a comprehensive physical exam. The patient reports no problems. She reports a monthly period. She recently underwent a breast biopsy which was benign for malignancy with plans for routine mammogram at the age of 37. Patient denies pelvic pain and abnormal discharge. She is sexually active without contraception and wishes to conceive. 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 IUI was offered but patient deferred.   Past Medical History:  Diagnosis Date   Endometriosis    Fibrocystic breast, right 10/11/2016   Irregular heart beat    Premature ventricular contractions 03/2014   Past Surgical History:  Procedure Laterality Date   BREAST BIOPSY Left 02/15/2022   [place] LT BREAST BX W LOC DEV 1ST LESION IMG BX SPEC US GUIDE 02/15/2022 [facility] MAMMOGRAPHY   HYSTEROSCOPY N/A 03/31/2021   Procedure: INTEROPERATIVE HYSTEROSCOPY WITH attempted RE-CATHETERIZATION OF LEFT TUBE USING NOVY CATHETERsuction /D&C/chromotubation;  Surgeon: Governor Specking, MD;  Location: [facility];  Service: Gynecology;  Laterality: N/A;   LAPAROSCOPIC LYSIS OF ADHESIONS N/A 03/31/2021   Procedure: LAPAROSCOPIC LYSIS OF ADHESIONS/excision of endometriosis;  Surgeon: Governor Specking, MD;  Location[facility];  Service: Gynecology;  Laterality: N/A;   WISDOM TOOTH EXTRACTION     Family History  Problem Relation Age of Onset   Asthma Mother    Diabetes Father     Social History   Socioeconomic History   Marital status: Divorced    Spouse name: Not on file   Number of children: 2   Years of education: Not on file   Highest education level: Not on file  Occupational History   Not on file  Tobacco Use   Smoking status: Never   Smokeless tobacco: Never  Vaping Use   Vaping Use: Never used  Substance and Sexual Activity   Alcohol use:  No   Drug use: No   Sexual activity: Yes    Partners: Male    Birth control/protection: Rhythm  Other Topics Concern   Not on file  Social History Narrative   Not on file   Social Determinants of Health   Financial Resource Strain: Not on file  Food Insecurity: No Food Insecurity (02/10/2022)   Hunger Vital Sign    Worried About Running Out of Food in the Last Year: Never true    Ran Out of Food in the Last Year: Never true  Transportation Needs: No Transportation Needs (02/10/2022)   PRAPARE - Hydrologist (Medical): No    Lack of Transportation (Non-Medical): No  Physical Activity: Not on file  Stress: Not on file  Social Connections: Not on file  Intimate Partner Violence: Not on file   Health Maintenance  Topic Date Due   INFLUENZA VACCINE  09/07/2021   DTaP/Tdap/Td (3 - Td or Tdap) 11/29/2021   PAP SMEAR-Modifier  11/26/2022   Hepatitis C Screening  Completed   HIV Screening  Completed   HPV VACCINES  Aged Out       Review of Systems Pertinent items noted in HPI and remainder of comprehensive ROS otherwise negative.   Objective:  Blood pressure 122/84, pulse (!) 50, height 5' (1.524 m), weight 116 lb (52.6 kg), last menstrual period 02/09/2022.   GENERAL: Well-developed, well-nourished female in no acute distress.  HEENT: Normocephalic, atraumatic. Sclerae anicteric.  NECK:  Supple. Normal thyroid.  LUNGS: Clear to auscultation bilaterally.  HEART: Regular rate and rhythm. BREASTS: Symmetric in size. No palpable masses or lymphadenopathy, skin changes, or nipple drainage. Steri strips intact at biopsy sites ABDOMEN: Soft, nontender, nondistended. No organomegaly. PELVIC: Normal external female genitalia. Vagina is pink and rugated.  Normal discharge. Normal appearing cervix. Uterus is normal in size. No adnexal mass or tenderness. Chaperone present during the pelvic exam EXTREMITIES: No cyanosis, clubbing, or edema, 2+ distal pulses.      Assessment:    Healthy female exam.      Plan:    Normal exam Patient current on pap smear Patient declined STI testing Encouraged taking prenatal vitamins  See After Visit Summary for Counseling Recommendations

## 2022-02-19 ENCOUNTER — Encounter: Payer: Self-pay | Admitting: Obstetrics and Gynecology

## 2022-03-15 IMAGING — US US PELVIS COMPLETE WITH TRANSVAGINAL
1 series · 13 of 25 positions shown · non-contrast
Comparison: None

CLINICAL DATA: Abnormal uterine bleeding



[Series 1: us pelvis complete with transvaginal · 0.18mm/px · 13 of 109 slices shown]
[im 1/109]
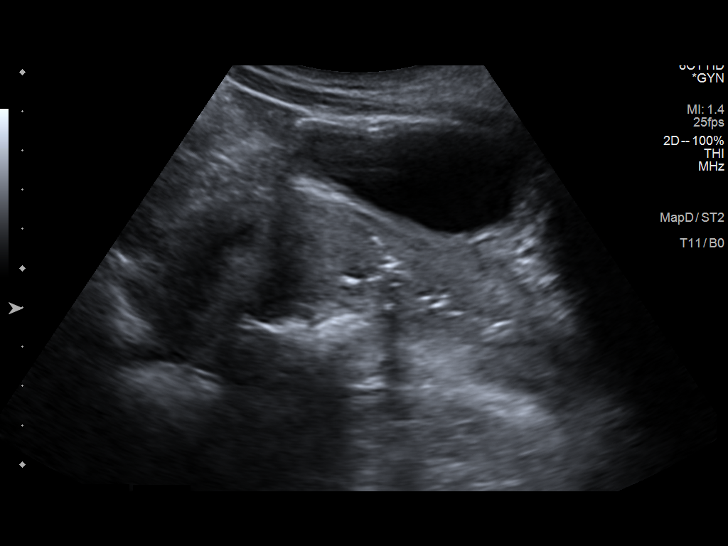
[im 10/109]
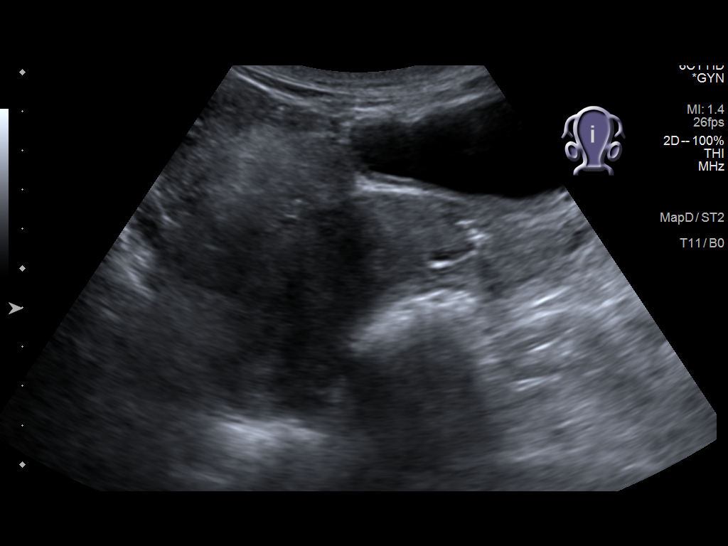
[im 19/109]
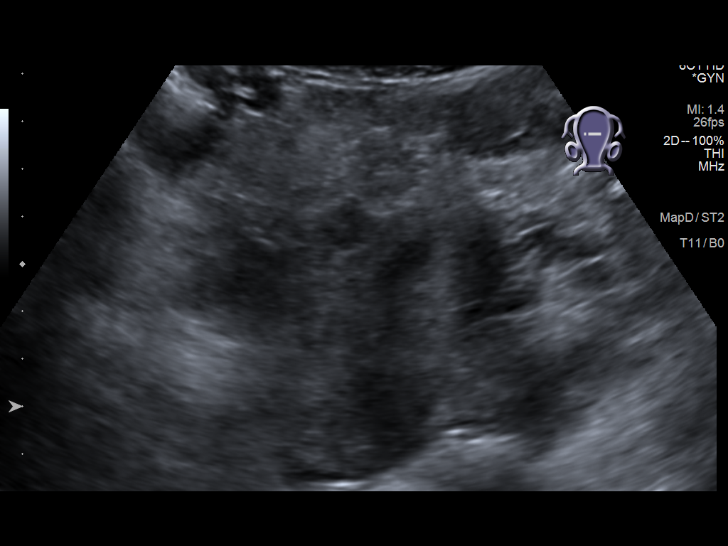
[im 28/109]
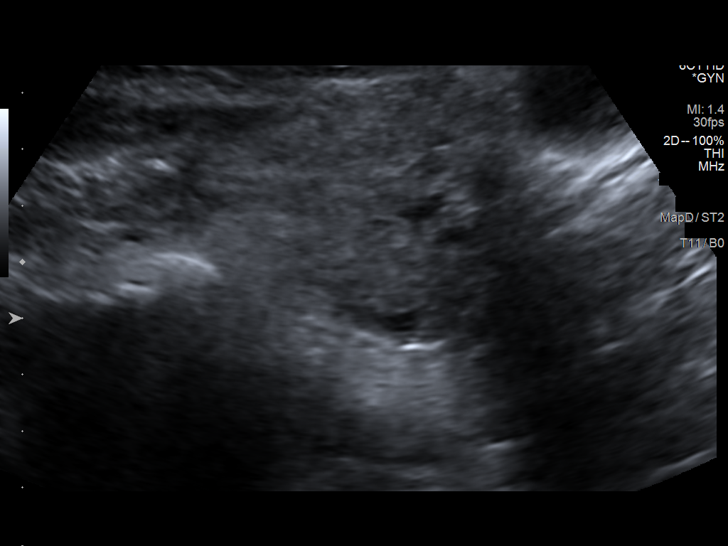
[im 37/109]
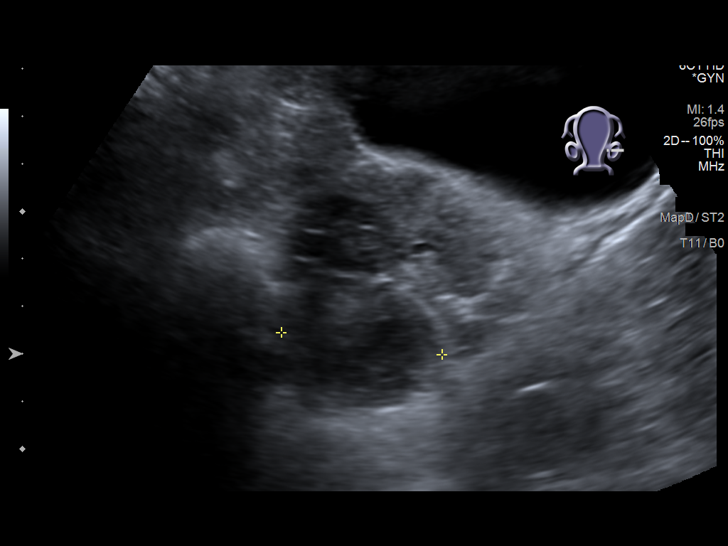
[im 46/109]
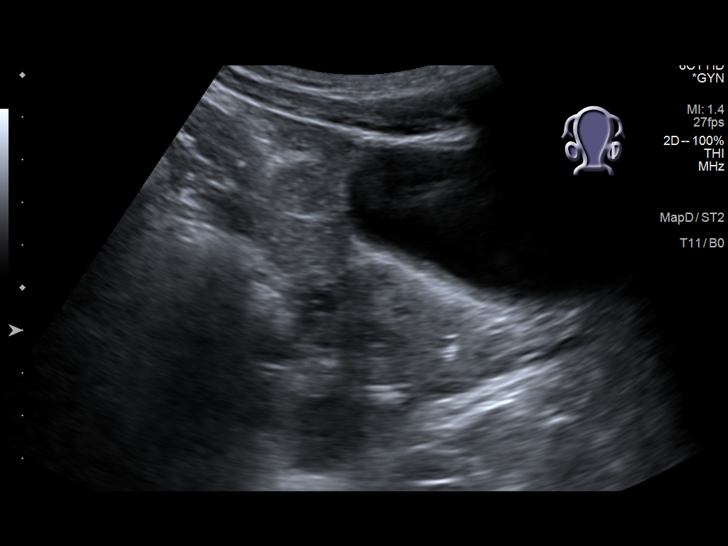
[im 55/109]
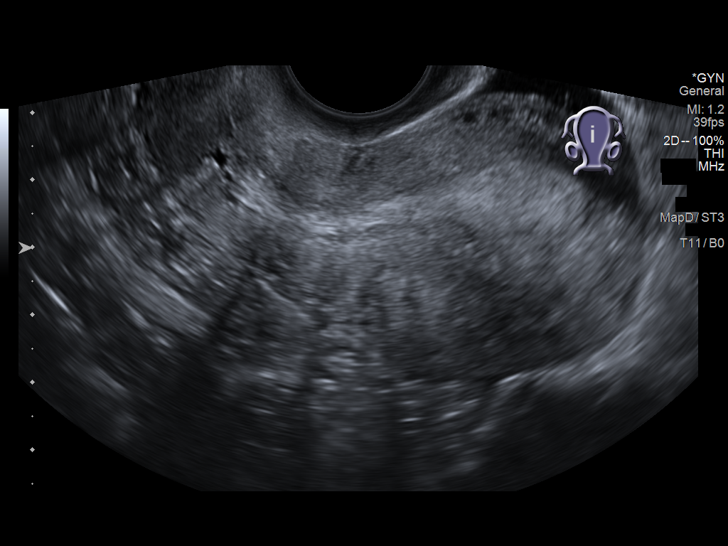
[im 64/109]
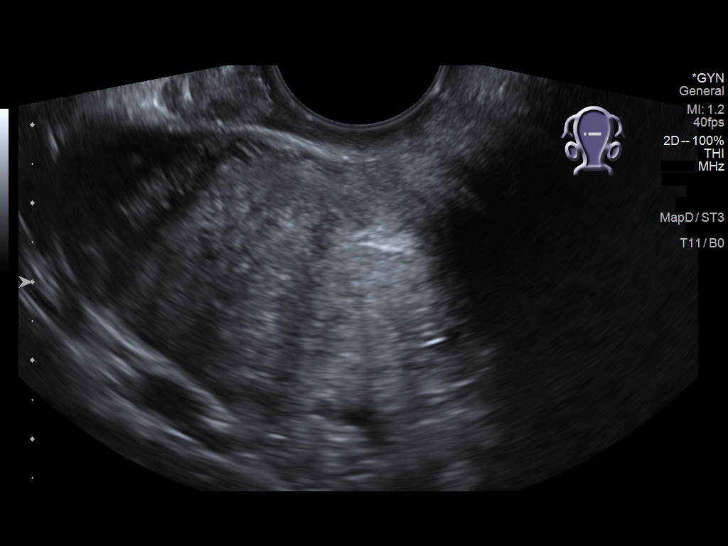
[im 73/109]
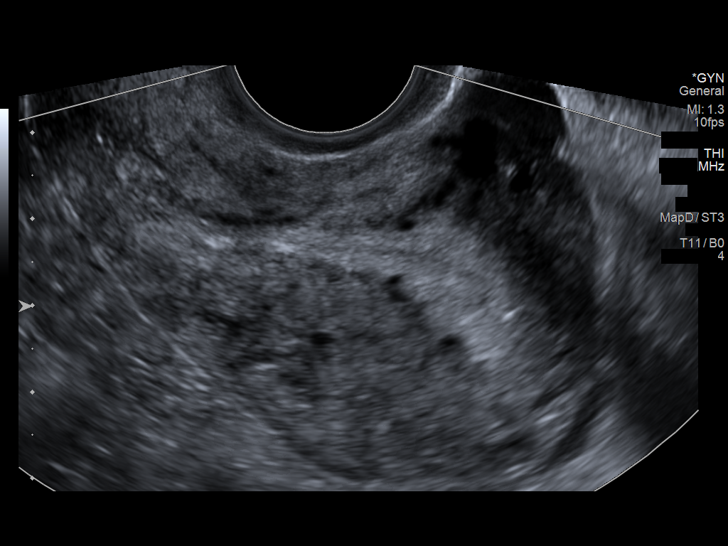
[im 82/109]
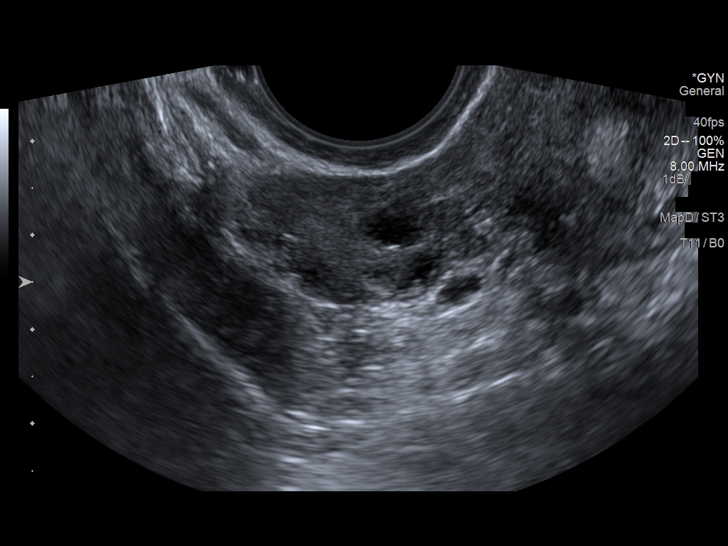
[im 91/109]
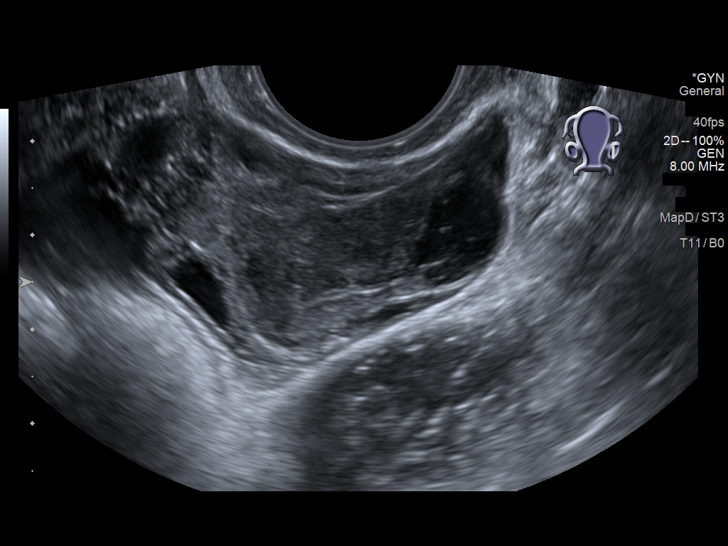
[im 100/109]
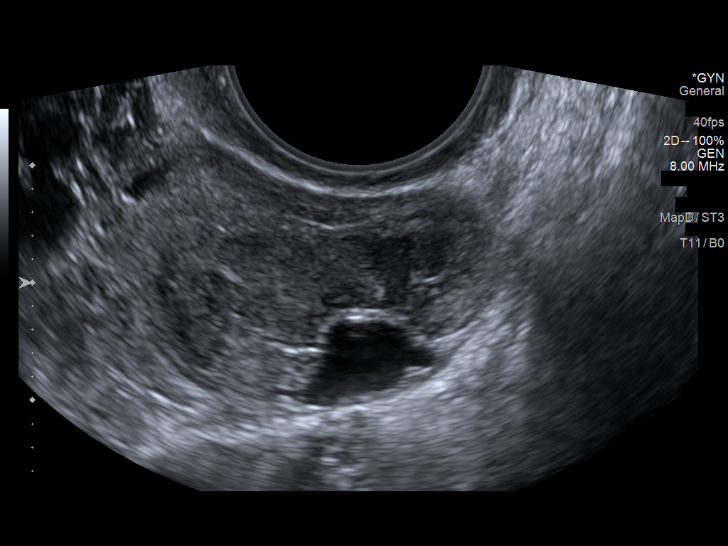
[im 109/109]
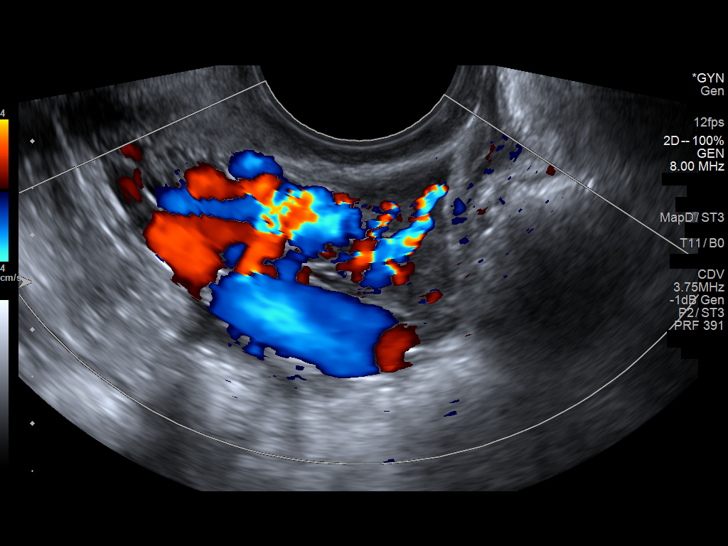

[13 of 25 positions shown; findings below may reference images not displayed]

FINDINGS: Uterus

Measurements: 8 x 4.1 x 5.1 cm = volume: 84 mL. No fibroids or other
mass visualized.

Endometrium

Thickness: 10 mm.  No focal abnormality visualized.

Right ovary

Measurements: 3 x 1.8 x 1.6 cm = volume: 4 mL. Normal appearance/no
adnexal mass.

Left ovary

Measurements: 3.7 x 2.3 x 2.9 cm = volume: 12 mL. There is a complex
mass involving the patient's left ovary measuring 3.2 x 1.9 x 2.9 cm
this contains no significant internal color Doppler flow but is
relatively solid in appearance.

Other findings

There are dilated left pelvic veins.
IMPRESSION: 1. No acute abnormality.
2. Indeterminate complex 3.2 cm mass involving the patient's left
ovary. This is favored to represent a hemorrhagic cyst or
endometrioma. A 6-12 week follow-up ultrasound is recommended to
document stability or resolution.
3. Dilated left pelvic veins. This is a nonspecific finding but can
be seen in patients with pelvic congestion syndrome.

## 2022-05-09 IMAGING — US US PELVIS COMPLETE WITH TRANSVAGINAL
1 series · 13 of 25 positions shown · non-contrast
Comparison: 04/16/2020

CLINICAL DATA: LEFT ovarian cyst, follow-up; LMP 05/18/2020

EXAM:
TRANSABDOMINAL AND TRANSVAGINAL ULTRASOUND OF PELVIS
TECHNIQUE: Both transabdominal and transvaginal ultrasound examinations of the
pelvis were performed. Transabdominal technique was performed for
global imaging of the pelvis including uterus, ovaries, adnexal
regions, and pelvic cul-de-sac. It was necessary to proceed with
endovaginal exam following the transabdominal exam to visualize the
endometrium and ovaries, and to characterize a LEFT ovarian cystic
lesion.

[Series 1: us pelvic complete with transvaginal · 98 acquisitions, 13 frames shown]
[im 1/98]
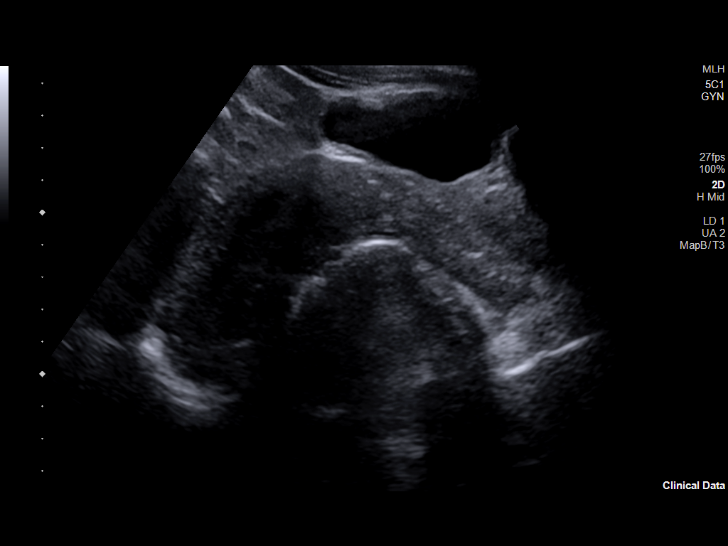
[im 9/98]
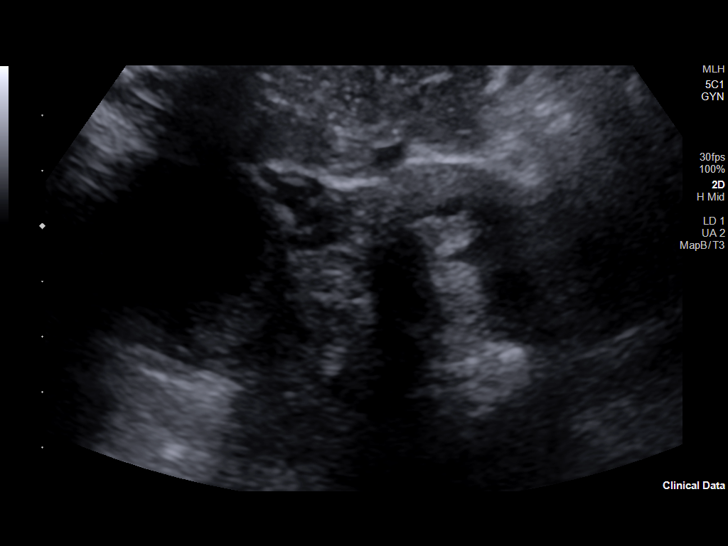
[im 17/98]
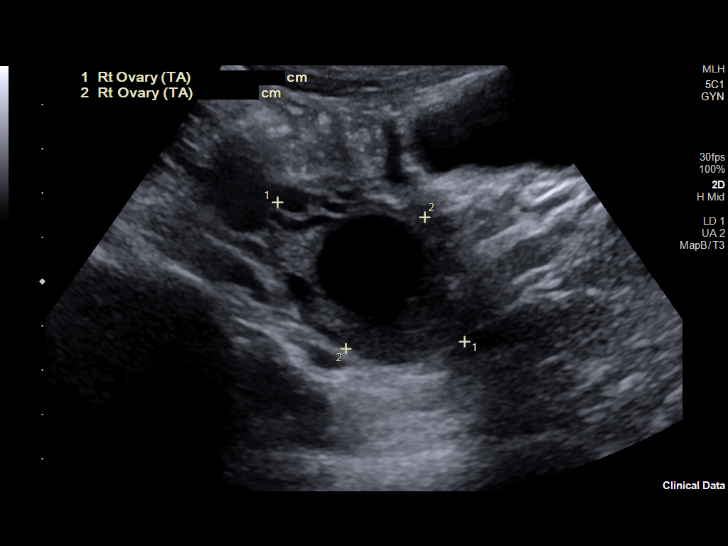
[im 25/98]
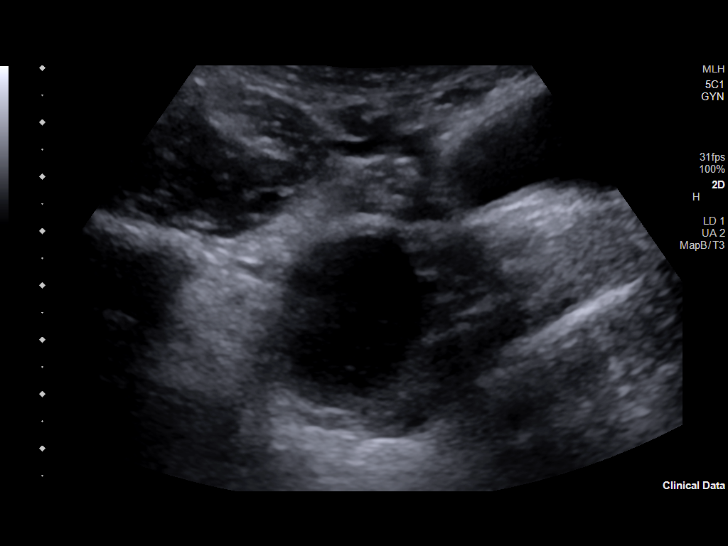
[im 33/98]
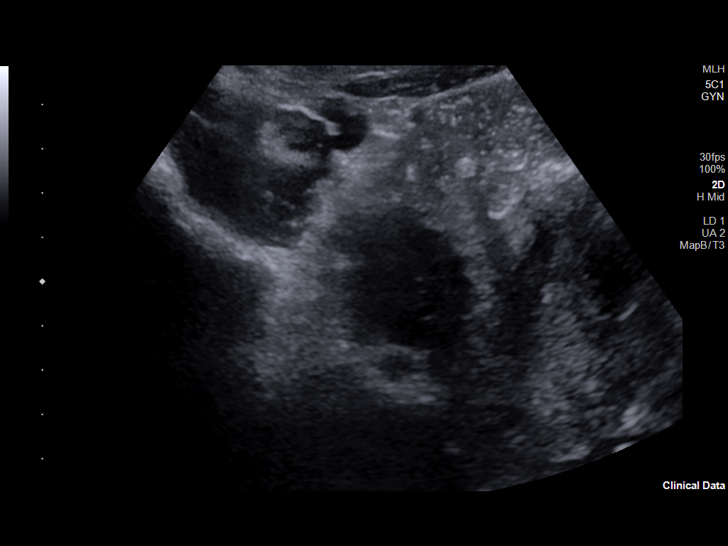
[im 41/98]
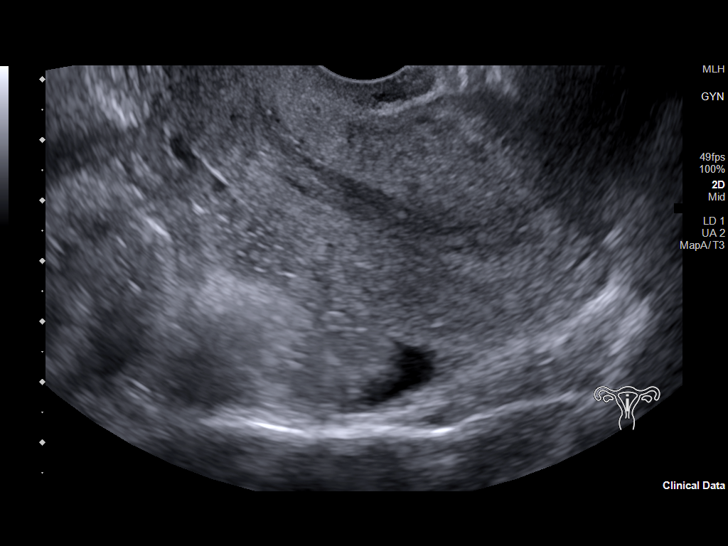
[im 49/98]
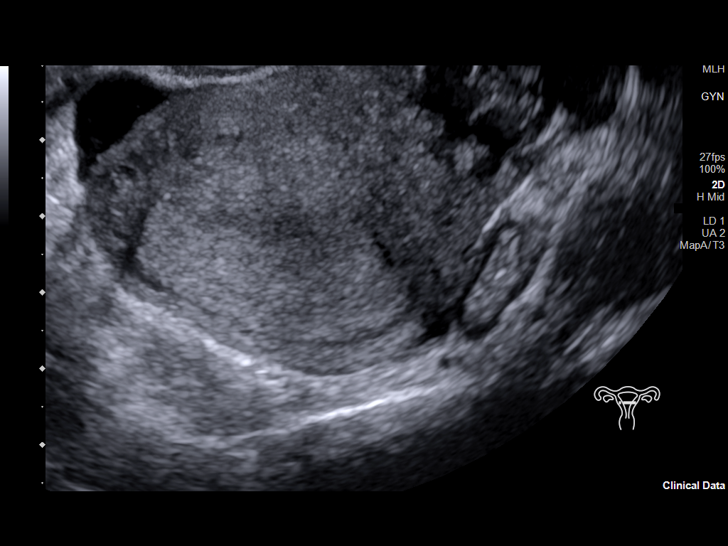
[im 57/98]
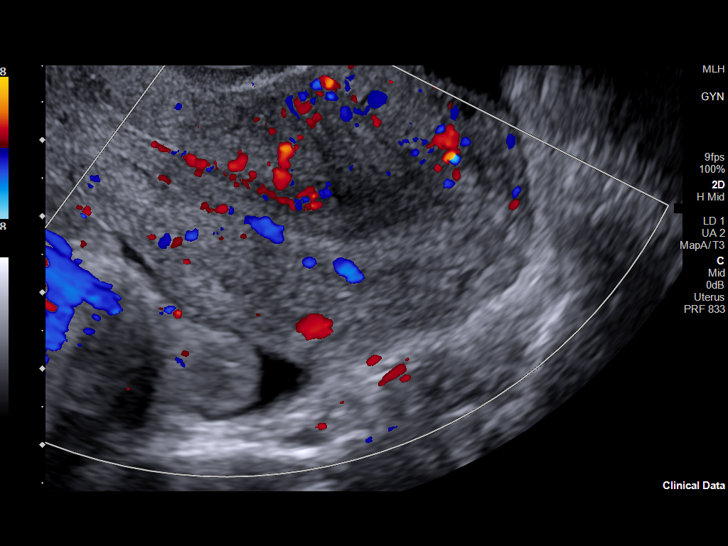
[im 65/98]
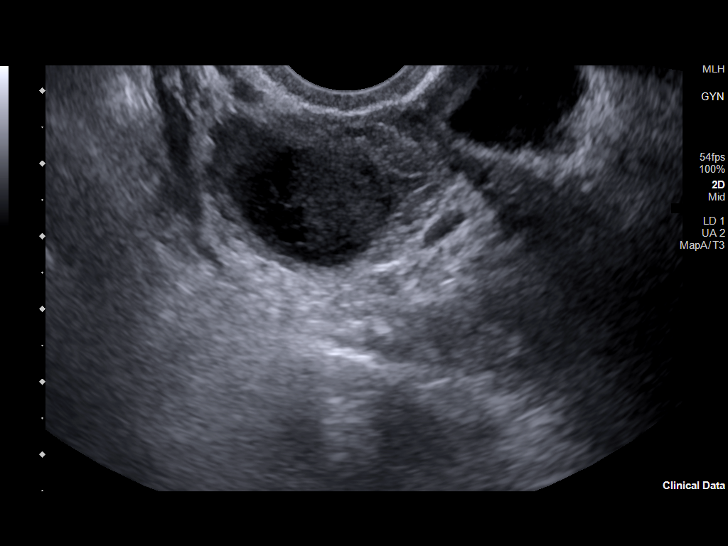
[im 73/98]
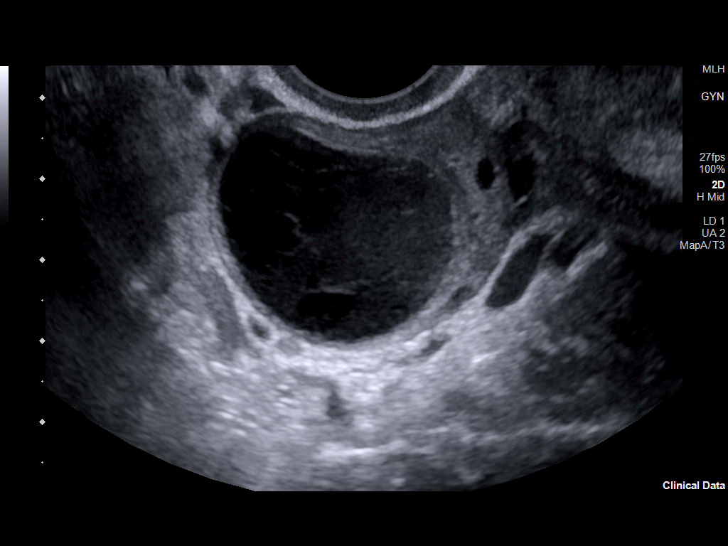
[im 81/98]
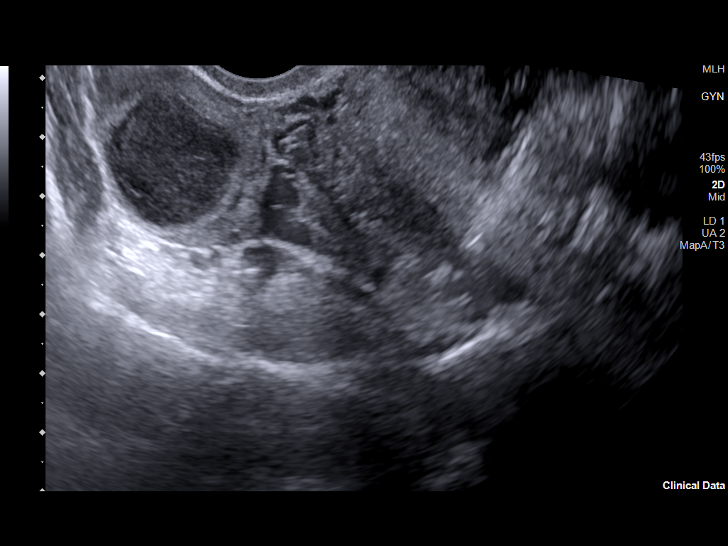
[im 89/98]
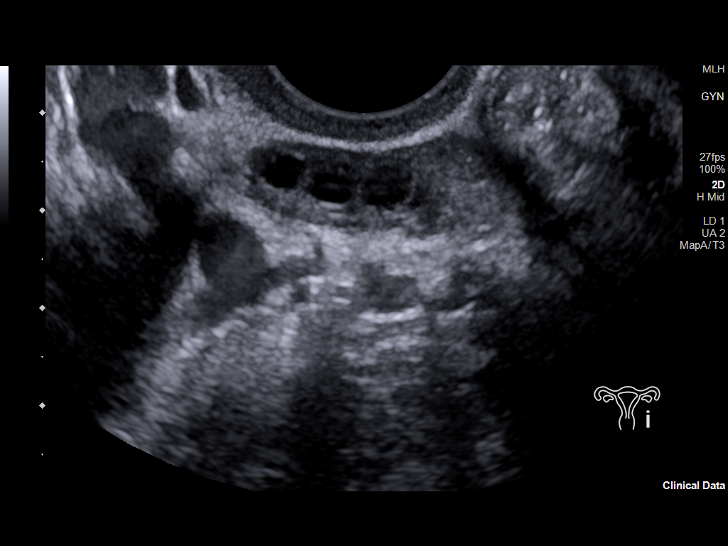
[im 98/98]
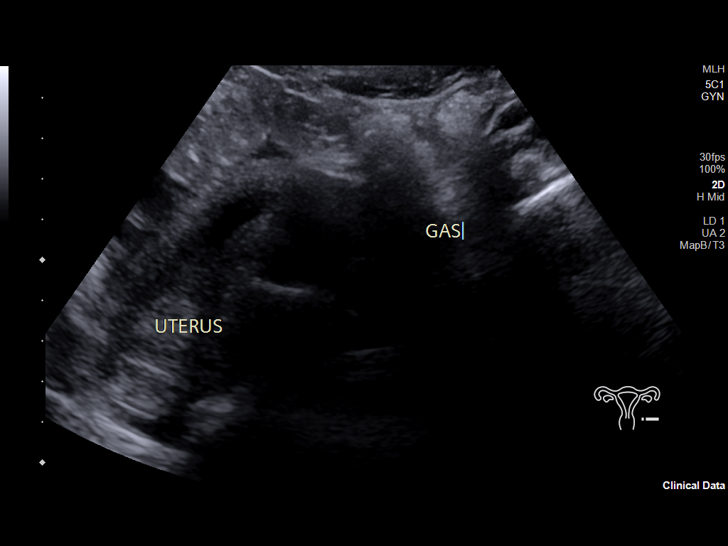

[13 of 25 positions shown; findings below may reference images not displayed]

FINDINGS: Uterus

Measurements: 8.2 x 3.8 x 4.6 cm = volume: 76 mL. Retroverted.
Normal morphology without mass

Endometrium

Thickness: 8 mm.  No endometrial fluid or focal abnormality

Right ovary

Measurements: 4.2 x 3.2 x 3.6 cm = volume: 25 mL. New small
hemorrhagic cyst/corpus luteum of the RIGHT ovary 2.9 x 2.6 x 2.9 cm
in size; no follow-up imaging recommended.

Left ovary

Measurements: 2.8 x 0.9 x 2.3 cm = volume: 3 mL. Normal morphology
without mass. Resolution of hemorrhagic cyst/corpus luteum seen on
the previous exam.

Other findings

No free pelvic fluid. No adnexal masses. Prominent patent vein
within LEFT adnexa 8 mm diameter, nonspecific.
IMPRESSION: Resolution of previously identified hemorrhagic cyst/corpus luteum
of the LEFT ovary.

New small 2.9 cm diameter hemorrhagic corpus luteum/cyst of the
RIGHT ovary; no follow-up imaging recommended.

No additional pelvic sonographic abnormalities.

## 2023-01-24 ENCOUNTER — Telehealth: Payer: Self-pay | Admitting: *Deleted

## 2023-01-24 NOTE — Telephone Encounter (Signed)
Left patient a message to call and schedule annual. 

## 2023-02-23 ENCOUNTER — Other Ambulatory Visit: Payer: Self-pay | Admitting: Nurse Practitioner

## 2023-02-23 ENCOUNTER — Ambulatory Visit: Payer: Self-pay

## 2023-02-23 DIAGNOSIS — R102 Pelvic and perineal pain: Secondary | ICD-10-CM

## 2023-02-23 DIAGNOSIS — S338XXA Sprain of other parts of lumbar spine and pelvis, initial encounter: Secondary | ICD-10-CM
